# Patient Record
Sex: Male | Born: 1940 | Race: Black or African American | Hispanic: No | Marital: Married | Smoking: Never smoker
Health system: Southern US, Community
[De-identification: ages and names within clinical notes are randomized; demographics above are authoritative.]

## PROBLEM LIST (undated history)

## (undated) DIAGNOSIS — E119 Type 2 diabetes mellitus without complications: Secondary | ICD-10-CM

## (undated) HISTORY — PX: SHOULDER SURGERY: SHX246

---

## 2002-07-19 ENCOUNTER — Emergency Department (HOSPITAL_COMMUNITY): Admission: EM | Admit: 2002-07-19 | Discharge: 2002-07-19 | Payer: Self-pay | Admitting: Emergency Medicine

## 2002-07-19 ENCOUNTER — Encounter: Payer: Self-pay | Admitting: Emergency Medicine

## 2003-03-04 ENCOUNTER — Encounter: Admission: RE | Admit: 2003-03-04 | Discharge: 2003-03-04 | Payer: Self-pay | Admitting: General Practice

## 2003-04-14 ENCOUNTER — Ambulatory Visit (HOSPITAL_COMMUNITY): Admission: RE | Admit: 2003-04-14 | Discharge: 2003-04-14 | Payer: Self-pay | Admitting: General Surgery

## 2003-09-29 ENCOUNTER — Emergency Department (HOSPITAL_COMMUNITY): Admission: EM | Admit: 2003-09-29 | Discharge: 2003-09-29 | Payer: Self-pay | Admitting: Family Medicine

## 2006-08-24 ENCOUNTER — Emergency Department (HOSPITAL_COMMUNITY): Admission: EM | Admit: 2006-08-24 | Discharge: 2006-08-24 | Payer: Self-pay | Admitting: Family Medicine

## 2007-01-04 ENCOUNTER — Emergency Department (HOSPITAL_COMMUNITY): Admission: EM | Admit: 2007-01-04 | Discharge: 2007-01-04 | Payer: Self-pay | Admitting: Family Medicine

## 2007-04-01 ENCOUNTER — Encounter (HOSPITAL_COMMUNITY): Admission: RE | Admit: 2007-04-01 | Discharge: 2007-04-05 | Payer: Self-pay | Admitting: Internal Medicine

## 2007-04-12 ENCOUNTER — Emergency Department (HOSPITAL_COMMUNITY): Admission: EM | Admit: 2007-04-12 | Discharge: 2007-04-12 | Payer: Self-pay | Admitting: Emergency Medicine

## 2007-08-28 ENCOUNTER — Ambulatory Visit: Payer: Self-pay | Admitting: Cardiology

## 2007-09-17 ENCOUNTER — Ambulatory Visit: Payer: Self-pay | Admitting: Cardiology

## 2007-09-17 ENCOUNTER — Ambulatory Visit: Payer: Self-pay

## 2007-09-17 LAB — CONVERTED CEMR LAB
Albumin: 4 g/dL (ref 3.5–5.2)
Alkaline Phosphatase: 83 units/L (ref 39–117)
Cholesterol: 158 mg/dL (ref 0–200)
LDL Cholesterol: 109 mg/dL — ABNORMAL HIGH (ref 0–99)
Total CHOL/HDL Ratio: 4.9
Total Protein: 7.5 g/dL (ref 6.0–8.3)
Triglycerides: 84 mg/dL (ref 0–149)
VLDL: 17 mg/dL (ref 0–40)

## 2008-04-01 DIAGNOSIS — E0590 Thyrotoxicosis, unspecified without thyrotoxic crisis or storm: Secondary | ICD-10-CM | POA: Insufficient documentation

## 2008-04-01 DIAGNOSIS — M47812 Spondylosis without myelopathy or radiculopathy, cervical region: Secondary | ICD-10-CM | POA: Insufficient documentation

## 2008-04-01 DIAGNOSIS — A048 Other specified bacterial intestinal infections: Secondary | ICD-10-CM | POA: Insufficient documentation

## 2009-12-29 ENCOUNTER — Emergency Department (HOSPITAL_COMMUNITY)
Admission: EM | Admit: 2009-12-29 | Discharge: 2009-12-30 | Payer: Self-pay | Source: Home / Self Care | Admitting: Emergency Medicine

## 2010-01-31 ENCOUNTER — Encounter
Admission: RE | Admit: 2010-01-31 | Discharge: 2010-02-08 | Payer: Self-pay | Source: Home / Self Care | Attending: Internal Medicine | Admitting: Internal Medicine

## 2010-02-15 ENCOUNTER — Ambulatory Visit
Payer: No Typology Code available for payment source | Attending: Internal Medicine | Admitting: Rehabilitative and Restorative Service Providers"

## 2010-02-15 DIAGNOSIS — M542 Cervicalgia: Secondary | ICD-10-CM | POA: Insufficient documentation

## 2010-02-15 DIAGNOSIS — IMO0001 Reserved for inherently not codable concepts without codable children: Secondary | ICD-10-CM | POA: Insufficient documentation

## 2010-02-15 DIAGNOSIS — M25519 Pain in unspecified shoulder: Secondary | ICD-10-CM | POA: Insufficient documentation

## 2010-02-15 DIAGNOSIS — M2569 Stiffness of other specified joint, not elsewhere classified: Secondary | ICD-10-CM | POA: Insufficient documentation

## 2010-02-15 DIAGNOSIS — M545 Low back pain, unspecified: Secondary | ICD-10-CM | POA: Insufficient documentation

## 2010-02-24 ENCOUNTER — Ambulatory Visit: Payer: No Typology Code available for payment source | Admitting: Physical Therapy

## 2010-03-01 ENCOUNTER — Ambulatory Visit: Payer: No Typology Code available for payment source

## 2010-03-08 ENCOUNTER — Ambulatory Visit: Payer: No Typology Code available for payment source | Admitting: Physical Therapy

## 2010-03-15 ENCOUNTER — Ambulatory Visit: Payer: Medicare Other | Attending: Internal Medicine | Admitting: Physical Therapy

## 2010-03-15 DIAGNOSIS — M25519 Pain in unspecified shoulder: Secondary | ICD-10-CM | POA: Insufficient documentation

## 2010-03-15 DIAGNOSIS — IMO0001 Reserved for inherently not codable concepts without codable children: Secondary | ICD-10-CM | POA: Insufficient documentation

## 2010-03-15 DIAGNOSIS — M2569 Stiffness of other specified joint, not elsewhere classified: Secondary | ICD-10-CM | POA: Insufficient documentation

## 2010-03-15 DIAGNOSIS — M545 Low back pain, unspecified: Secondary | ICD-10-CM | POA: Insufficient documentation

## 2010-03-15 DIAGNOSIS — M542 Cervicalgia: Secondary | ICD-10-CM | POA: Insufficient documentation

## 2010-03-16 ENCOUNTER — Ambulatory Visit: Payer: Medicare Other | Admitting: Physical Therapy

## 2010-03-22 ENCOUNTER — Ambulatory Visit: Payer: Medicare Other | Admitting: Physical Therapy

## 2010-03-24 ENCOUNTER — Ambulatory Visit: Payer: Medicare Other | Admitting: Physical Therapy

## 2010-03-29 ENCOUNTER — Ambulatory Visit: Payer: Medicare Other | Admitting: Physical Therapy

## 2010-04-06 ENCOUNTER — Ambulatory Visit: Payer: Medicare Other | Admitting: Physical Therapy

## 2010-05-24 NOTE — Procedures (Signed)
[facility]                              EXERCISE Travis Mendoza, Travis Mendoza                          MRN:          [id number]  DATE:09/17/2007                            DOB:          09/22/1940    PRIMARY CARE PHYSICIAN:  Fleet Contras, MD at the [facility].   INDICATIONS FOR PROCEDURE:  Atypical chest pain and a 70 year old with  several risk factors.  The patient does have a normal EKG at baseline  procedure.   PROCEDURE NOTE:  The patient did exercise for 6 minutes using standard  Bruce protocol.  He reached the end of stage II.  He reached peak heart  rate of 141, which was 91% of predicted maximal heart rate.  He did  exercise 7.2 METs.  Blood pressure response was normal with a rise from  127/70 to 170/79 at peak exercise.  We did stop exercise due to fatigue.  There was no chest pain.  Review of exercise electrogram showed only  nonspecific slight upsloping ST depression in the inferior and anterior  lateral leads.  There are no significant ST changes suggestive of  ischemia.  There were no arrhythmias.   INTERPRETATION:  This is a negative treadmill stress test for ischemia.  The patient has normal exercise tolerance.   CONCLUSION:  Negative stress treadmill.     Marca Ancona, MD  Electronically Signed    DM/MedQ  DD: 09/17/2007  DT: 09/18/2007  Job #: [id number]   cc:   Fleet Contras, M.D.  Noralyn Pick. Eden Emms, MD, [facility]

## 2010-05-24 NOTE — Assessment & Plan Note (Signed)
[facility]                            CARDIOLOGY OFFICE NOTE   Travis Mendoza, Travis Mendoza                          MRN:          [id number]  DATE:08/28/2007                            DOB:          1940-02-05    PRIMARY CARE PHYSICIAN:  Fleet Contras, MD, at the [facility].   HISTORY OF PRESENT ILLNESS:  This is a 70 year old with a history of  hypothyroidism who was seen in evaluation for chest pain.  Of note, the  patient is from [place], he speaks minimal English and brings his niece  today to interpret for him.  The patient was seen in his primary care  doctor's office, where he was complaining of some chest pain.  He is  seen here today and states that he has had no further chest pain, since  he has taken medication for reflux and H. Pylori.  The patient states  that prior to coming to his primary care physician's office in July, he  was having episodes of burning in his chest when he would eat.  This  would really only occur with eating or drinking and did not occur with  exertion.  He was seen by his primary care physician.  He was started on  a Prevpac for treatment of H. pylori infection.  He has completed the  Prevpac and states that the chest burning that he was having has  completely resolved now.  He has had no episodes of chest pain or  burning in the last couple of weeks.  He does complain of pain in his  right upper back on examination at the site where he appears to have a  lipoma.  He also complains of some neck pain and he does have a history  of cervical spondylosis.  He does have reasonably good exercise  capacity.  He walks for long distances and he jogs, does not get chest  pain with this activities.  He is mildly short of breath if he jogs, and  he thinks this is probably due to getting older.  He has no orthopnea,  no PND, no episodes of syncope, or presyncope.  He also does report an  episodic headache.   EKG today; normal  sinus rhythm with nonspecific lateral T-wave  inversion.   MEDICATIONS:  1. Propranolol 10 mg b.i.d.  2. Albuterol p.r.n.  3. Aspirin 81 mg daily.   ALLERGIES:  The patient has no known drug allergies.   PAST MEDICAL HISTORY:  1. Goiter and hyperthyroidism.  2. Cervical spondylosis.  3. Past smoking, the patient did quite about 2 months ago.  4. Helicobacter pylori infection treated with a Prevpac.   SOCIAL HISTORY:  The patient is from [place].  He has been in the [place] for  about 4 years.  He speaks minimal [place].  He has an interpreter, his  niece, who has come with him today.  His immediate family is also in  [place].  He used to smoke 3 cigarettes a day, however, he quit completely  2 months ago.  He does work in  a factory.  He does not drink alcohol.  He use any illicit drugs.   FAMILY HISTORY:  The patient denies a family history of early onset of  coronary artery disease.   REVIEW OF SYSTEMS:  This is negative, except as noted in the history of  present illness.   PHYSICAL EXAMINATION:  VITALS:  Blood pressure is 131/79, heart rate is  95 and regular, and weight is 197 pounds.  GENERAL:  In no apparent distress, well-developed white male.  NEUROLOGICAL:  Alert and oriented x3.  Normal affect.  NECK:  JVP is 7-8 cm.  There is a smooth, symmetric goiter.  LUNGS:  Clear to auscultation bilaterally.  Normal respiratory  excursion.  CARDIOVASCULAR:  Regular S1 and S2.  No S3, no S4, and no murmur.  No  peripheral edema.  Posterior tibial pulses are 2+ bilaterally.  EXTREMITIES:  No clubbing or cyanosis.  HEENT:  Normal.  SKIN:  Normal.  MUSCULOSKELETAL:  There is mild tenderness to palpation over the  posterior cervical spine.  There is also a soft tissue mass in the right  upper back that is consistent with a lipoma.   ASSESSMENT AND PLAN:  This is a 70 year old who was seen in his primary  care doctor's office with chest burning, this is since appears to have  resolved  after taking a Prevpac.  1. Chest discomfort.  The patient has had no further chest discomfort      now for a couple of weeks.  He was having burning in his chest with      eating or drinking.  He did not get any symptoms with exertion.      The symptoms has completely resolved since he took a Prevpac.  He      also has pain in his right upper back, this localized to the site      of what appears to be a lipoma.  I do not think that the patient      has been having ischemic chest pain.  He does have some nonspecific      changes on his EKG.  I do, however, think it will be reasonable to      obtain an exercise treadmill test for this patient.  We will go      ahead and schedule him for an exercise treadmill test.  Also, he      should continue taking an aspirin 81 mg a day.  2. Hypothyroidism.  The patient's most recent labs showed a TSH of      less than 0.004, free T4 elevated at 2, and T3 elevated at 290.  He      does have a goiter.  The patient states that he did see an      endocrine doctor yesterday.  However, he was not started on any new      medications and he does not remember exactly what they told him.      He is supposed to follow up again in 3 months.  He does need a      close endocrine followup for his hyperthyroidism.  The patient      should continue taking his propranolol 10 mg twice a day.  3. We will check the patient's lipids.     Marca Ancona, MD  Electronically Signed    DM/MedQ  DD: 08/28/2007  DT: 08/29/2007  Job #: [id number]   cc:   Fleet Contras, M.D.  Bruce Elvera Lennox Juanda Chance,  MD, [facility]

## 2010-10-14 LAB — DIFFERENTIAL
Basophils Absolute: 0
Basophils Relative: 0
Eosinophils Absolute: 0.2
Eosinophils Relative: 2
Lymphocytes Relative: 17
Lymphs Abs: 1.6
Monocytes Absolute: 1.1 — ABNORMAL HIGH
Monocytes Relative: 12
Neutro Abs: 6.3
Neutrophils Relative %: 68

## 2010-10-14 LAB — BASIC METABOLIC PANEL
Calcium: 8.6
GFR calc Af Amer: >60
GFR calc non Af Amer: >60
Glucose, Bld: 135 — ABNORMAL HIGH
Potassium: 3.5
Sodium: 139

## 2010-10-14 LAB — CBC
HCT: 41.8
Hemoglobin: 13.8
MCHC: 32.9
MCV: 81.4
Platelets: 189
RBC: 5.14
RDW: 13.7
WBC: 9.3

## 2010-10-14 LAB — BASIC METABOLIC PANEL WITH GFR
BUN: 9
CO2: 26
Chloride: 106
Creatinine, Ser: 0.66

## 2010-10-14 LAB — RAPID STREP SCREEN (MED CTR MEBANE ONLY): Streptococcus, Group A Screen (Direct): NEGATIVE

## 2011-03-24 ENCOUNTER — Ambulatory Visit
Admission: RE | Admit: 2011-03-24 | Discharge: 2011-03-24 | Disposition: A | Discharge status: Home / Self Care | Payer: No Typology Code available for payment source | Source: Ambulatory Visit | Attending: Orthopedic Surgery | Admitting: Orthopedic Surgery

## 2011-03-24 ENCOUNTER — Ambulatory Visit
Admission: RE | Admit: 2011-03-24 | Discharge: 2011-03-24 | Disposition: A | Discharge status: Home / Self Care | Payer: PRIVATE HEALTH INSURANCE | Source: Ambulatory Visit | Attending: Orthopedic Surgery | Admitting: Orthopedic Surgery

## 2011-03-24 ENCOUNTER — Other Ambulatory Visit: Payer: Self-pay | Admitting: Orthopedic Surgery

## 2011-03-24 DIAGNOSIS — M79603 Pain in arm, unspecified: Secondary | ICD-10-CM

## 2011-10-21 ENCOUNTER — Emergency Department (HOSPITAL_COMMUNITY)
Admission: EM | Admit: 2011-10-21 | Discharge: 2011-10-21 | Disposition: A | Discharge status: Home / Self Care | Payer: Medicare Other | Attending: Emergency Medicine | Admitting: Emergency Medicine

## 2011-10-21 ENCOUNTER — Encounter (HOSPITAL_COMMUNITY): Payer: Self-pay | Admitting: *Deleted

## 2011-10-21 DIAGNOSIS — K0889 Other specified disorders of teeth and supporting structures: Secondary | ICD-10-CM

## 2011-10-21 DIAGNOSIS — K029 Dental caries, unspecified: Secondary | ICD-10-CM | POA: Insufficient documentation

## 2011-10-21 MED ORDER — PENICILLIN V POTASSIUM 500 MG PO TABS: 500.0000 mg | ORAL_TABLET | Freq: Three times a day (TID) | ORAL | Status: DC

## 2011-10-21 MED ORDER — HYDROCODONE-ACETAMINOPHEN 10-325 MG PO TABS: 1.0000 | ORAL_TABLET | Freq: Four times a day (QID) | ORAL | Status: DC | PRN

## 2011-10-21 NOTE — ED Provider Notes (Signed)
History     CSN: [id number]  Arrival date & time 10/21/11  1711   First MD Initiated Contact with Patient 10/21/11 2017      Chief Complaint  Patient presents with  . Dental Pain   HPI  History provided by the patient and son. Patient is a 71 year old male with no significant PMH who presents with complaints of left upper molar pain. Symptoms began yesterday and have worsened significantly. Patient denies any associated swelling of the gums or bleeding. Denies any fever, chills or sweats. Denies any nausea vomiting. He denies any dental injury. Patient denies any other aggravating or alleviating factors. He has not used anything else for his symptoms.    History reviewed. No pertinent past medical history.  History reviewed. No pertinent past surgical history.  No family history on file.  History  Substance Use Topics  . Smoking status: Never Smoker   . Smokeless tobacco: Not on file  . Alcohol Use: No      Review of Systems  Constitutional: Negative for fever and chills.  HENT: Positive for dental problem. Negative for sore throat and trouble swallowing.   Gastrointestinal: Negative for nausea and vomiting.  Skin: Negative for rash.    Allergies  Review of patient's allergies indicates no known allergies.  Home Medications  No current outpatient prescriptions on file.  BP 125/73  Pulse 100  Temp 98.2 F (36.8 C) (Oral)  Resp 20  SpO2 97%  Physical Exam  Nursing note and vitals reviewed. Constitutional: He is oriented to person, place, and time. He appears well-developed and well-nourished. No distress.  HENT:  Head: Normocephalic.  Mouth/Throat:         Pain with percussion over left upper first molar.  Dental caries present.  No swelling of adjacent gums.  Neck: Normal range of motion. Neck supple.  Cardiovascular: Normal rate and regular rhythm.   Pulmonary/Chest: Effort normal and breath sounds normal.  Lymphadenopathy:    He has no cervical  adenopathy.  Neurological: He is alert and oriented to person, place, and time.  Skin: Skin is warm.  Psychiatric: He has a normal mood and affect. His behavior is normal.    ED Course  Procedures   Dental Block Performed by: Angus Seller Authorized by: Angus Seller Consent: Verbal consent obtained. Risks and benefits: risks, benefits and alternatives were discussed Consent given by: patient Patient identity confirmed: provided demographic data  Location: Left upper first molar  Local anesthetic: Bupivacaine 0.5% with epinephrine  Anesthetic total: 1.8 ml  Irrigation method: syringe  Patient tolerance: Patient tolerated the procedure well with no immediate complications. Pain improved.       1. Pain, dental   2. Dental caries       MDM  8:35PM Pt seen and evaluated.  Pt in no acute distress.  Pt does not appear toxic.  Pain improved after dental block.  Pt given dental referral.        Angus Seller, PA 10/22/11 2122

## 2011-10-21 NOTE — ED Notes (Signed)
Pt updated on wait time.  

## 2011-10-21 NOTE — ED Notes (Signed)
Patient has pain in the left upper 4 and 5th tooth.  There is noted filling and dark area to the teeth.  Onset of pain last night.  Patient friend is here to interpret

## 2011-10-23 NOTE — ED Provider Notes (Signed)
Medical screening examination/treatment/procedure(s) were performed by non-physician practitioner and as supervising physician I was immediately available for consultation/collaboration.  Doug Sou, MD 10/23/11 [phone]

## 2011-12-18 ENCOUNTER — Other Ambulatory Visit: Payer: Self-pay | Admitting: Internal Medicine

## 2011-12-18 DIAGNOSIS — E042 Nontoxic multinodular goiter: Secondary | ICD-10-CM

## 2011-12-27 ENCOUNTER — Ambulatory Visit
Admission: RE | Admit: 2011-12-27 | Discharge: 2011-12-27 | Disposition: A | Discharge status: Home / Self Care | Payer: Medicare Other | Source: Ambulatory Visit | Attending: Internal Medicine | Admitting: Internal Medicine

## 2011-12-27 ENCOUNTER — Ambulatory Visit
Admission: RE | Admit: 2011-12-27 | Discharge: 2011-12-27 | Disposition: A | Discharge status: Home / Self Care | Payer: PRIVATE HEALTH INSURANCE | Source: Ambulatory Visit | Attending: Internal Medicine | Admitting: Internal Medicine

## 2011-12-27 DIAGNOSIS — E042 Nontoxic multinodular goiter: Secondary | ICD-10-CM

## 2012-02-08 ENCOUNTER — Other Ambulatory Visit (HOSPITAL_COMMUNITY): Payer: Self-pay | Admitting: Internal Medicine

## 2012-02-08 ENCOUNTER — Ambulatory Visit (HOSPITAL_COMMUNITY)
Admission: RE | Admit: 2012-02-08 | Discharge: 2012-02-08 | Disposition: A | Discharge status: Home / Self Care | Payer: Medicare Other | Source: Ambulatory Visit | Attending: Internal Medicine | Admitting: Internal Medicine

## 2012-02-08 DIAGNOSIS — R071 Chest pain on breathing: Secondary | ICD-10-CM

## 2012-02-08 DIAGNOSIS — E049 Nontoxic goiter, unspecified: Secondary | ICD-10-CM | POA: Insufficient documentation

## 2012-05-16 ENCOUNTER — Other Ambulatory Visit: Payer: Self-pay | Admitting: Internal Medicine

## 2012-05-16 DIAGNOSIS — E0590 Thyrotoxicosis, unspecified without thyrotoxic crisis or storm: Secondary | ICD-10-CM

## 2012-05-17 ENCOUNTER — Other Ambulatory Visit: Payer: Self-pay | Admitting: Gastroenterology

## 2012-05-27 ENCOUNTER — Encounter (HOSPITAL_COMMUNITY)
Admission: RE | Admit: 2012-05-27 | Discharge: 2012-05-27 | Disposition: A | Discharge status: Home / Self Care | Payer: Medicare Other | Source: Ambulatory Visit | Attending: Internal Medicine | Admitting: Internal Medicine

## 2012-05-27 DIAGNOSIS — R221 Localized swelling, mass and lump, neck: Secondary | ICD-10-CM | POA: Insufficient documentation

## 2012-05-27 DIAGNOSIS — E0520 Thyrotoxicosis with toxic multinodular goiter without thyrotoxic crisis or storm: Secondary | ICD-10-CM | POA: Insufficient documentation

## 2012-05-27 DIAGNOSIS — R220 Localized swelling, mass and lump, head: Secondary | ICD-10-CM | POA: Insufficient documentation

## 2012-05-27 DIAGNOSIS — E0590 Thyrotoxicosis, unspecified without thyrotoxic crisis or storm: Secondary | ICD-10-CM

## 2012-05-27 MED ORDER — SODIUM IODIDE I 131 CAPSULE
7.8000 | Freq: Once | INTRAVENOUS | Status: AC | PRN
  Administered 2012-05-27: 7.8 via ORAL

## 2012-05-28 ENCOUNTER — Encounter (HOSPITAL_COMMUNITY)
Admission: RE | Admit: 2012-05-28 | Discharge: 2012-05-28 | Disposition: A | Discharge status: Home / Self Care | Payer: Medicare Other | Source: Ambulatory Visit | Attending: Internal Medicine | Admitting: Internal Medicine

## 2012-05-30 ENCOUNTER — Encounter (HOSPITAL_COMMUNITY): Payer: Medicare Other

## 2012-06-07 ENCOUNTER — Encounter (HOSPITAL_COMMUNITY)
Admission: RE | Admit: 2012-06-07 | Discharge: 2012-06-07 | Disposition: A | Discharge status: Home / Self Care | Payer: Medicare Other | Source: Ambulatory Visit | Attending: Internal Medicine | Admitting: Internal Medicine

## 2012-06-07 DIAGNOSIS — E0590 Thyrotoxicosis, unspecified without thyrotoxic crisis or storm: Secondary | ICD-10-CM

## 2012-06-07 DIAGNOSIS — E0520 Thyrotoxicosis with toxic multinodular goiter without thyrotoxic crisis or storm: Secondary | ICD-10-CM | POA: Insufficient documentation

## 2012-06-07 MED ORDER — SODIUM IODIDE I 131 CAPSULE
44.0000 | Freq: Once | INTRAVENOUS | Status: AC | PRN
  Administered 2012-06-07: 44 via ORAL

## 2014-09-03 ENCOUNTER — Encounter (HOSPITAL_COMMUNITY): Payer: Self-pay | Admitting: Family Medicine

## 2014-09-03 ENCOUNTER — Emergency Department (HOSPITAL_COMMUNITY): Payer: Medicare Other

## 2014-09-03 ENCOUNTER — Emergency Department (HOSPITAL_COMMUNITY)
Admission: EM | Admit: 2014-09-03 | Discharge: 2014-09-03 | Disposition: A | Discharge status: Home / Self Care | Payer: Medicare Other | Attending: Emergency Medicine | Admitting: Emergency Medicine

## 2014-09-03 DIAGNOSIS — R1031 Right lower quadrant pain: Secondary | ICD-10-CM | POA: Insufficient documentation

## 2014-09-03 DIAGNOSIS — Z792 Long term (current) use of antibiotics: Secondary | ICD-10-CM | POA: Insufficient documentation

## 2014-09-03 DIAGNOSIS — R109 Unspecified abdominal pain: Secondary | ICD-10-CM

## 2014-09-03 LAB — URINALYSIS, ROUTINE W REFLEX MICROSCOPIC
BILIRUBIN URINE: NEGATIVE
Glucose, UA: NEGATIVE mg/dL
Ketones, ur: NEGATIVE mg/dL
Leukocytes, UA: NEGATIVE
NITRITE: NEGATIVE
PROTEIN: NEGATIVE mg/dL
SPECIFIC GRAVITY, URINE: 1.022 (ref 1.005–1.030)
UROBILINOGEN UA: 1 mg/dL (ref 0.0–1.0)
pH: 6 (ref 5.0–8.0)

## 2014-09-03 LAB — COMPREHENSIVE METABOLIC PANEL
ALK PHOS: 55 U/L (ref 38–126)
ALT: 21 U/L (ref 17–63)
ANION GAP: 7 (ref 5–15)
AST: 20 U/L (ref 15–41)
Albumin: 3.9 g/dL (ref 3.5–5.0)
BILIRUBIN TOTAL: 0.9 mg/dL (ref 0.3–1.2)
BUN: 14 mg/dL (ref 6–20)
CALCIUM: 9.3 mg/dL (ref 8.9–10.3)
CO2: 26 mmol/L (ref 22–32)
Chloride: 105 mmol/L (ref 101–111)
Creatinine, Ser: 0.98 mg/dL (ref 0.61–1.24)
GFR calc Af Amer: >60 mL/min (ref >60)
Glucose, Bld: 106 mg/dL — ABNORMAL HIGH (ref 65–99)
POTASSIUM: 3.9 mmol/L (ref 3.5–5.1)
Sodium: 138 mmol/L (ref 135–145)
TOTAL PROTEIN: 8 g/dL (ref 6.5–8.1)

## 2014-09-03 LAB — CBC
HEMATOCRIT: 45.7 % (ref 39.0–52.0)
HEMOGLOBIN: 15.3 g/dL (ref 13.0–17.0)
MCH: 29.1 pg (ref 26.0–34.0)
MCHC: 33.5 g/dL (ref 30.0–36.0)
MCV: 87 fL (ref 78.0–100.0)
Platelets: 202 10*3/uL (ref 150–400)
RBC: 5.25 MIL/uL (ref 4.22–5.81)
RDW: 12.6 % (ref 11.5–15.5)
WBC: 11.2 10*3/uL — AB (ref 4.0–10.5)

## 2014-09-03 LAB — URINE MICROSCOPIC-ADD ON

## 2014-09-03 LAB — LIPASE, BLOOD: Lipase: 26 U/L (ref 22–51)

## 2014-09-03 MED ORDER — ONDANSETRON HCL 4 MG/2ML IJ SOLN
4.0000 mg | Freq: Once | INTRAMUSCULAR | Status: AC
  Administered 2014-09-03: 4 mg via INTRAVENOUS
  Filled 2014-09-03: qty 2

## 2014-09-03 MED ORDER — FAMOTIDINE 20 MG PO TABS
20.0000 mg | ORAL_TABLET | Freq: Once | ORAL | Status: AC
  Administered 2014-09-03: 20 mg via ORAL
  Filled 2014-09-03: qty 1

## 2014-09-03 MED ORDER — IOHEXOL 300 MG/ML  SOLN
100.0000 mL | Freq: Once | INTRAMUSCULAR | Status: AC | PRN
  Administered 2014-09-03: 100 mL via INTRAVENOUS

## 2014-09-03 MED ORDER — IOHEXOL 300 MG/ML  SOLN
25.0000 mL | Freq: Once | INTRAMUSCULAR | Status: AC | PRN
  Administered 2014-09-03: 25 mL via ORAL

## 2014-09-03 MED ORDER — SODIUM CHLORIDE 0.9 % IV BOLUS (SEPSIS)
500.0000 mL | Freq: Once | INTRAVENOUS | Status: AC
  Administered 2014-09-03: 500 mL via INTRAVENOUS

## 2014-09-03 MED ORDER — HYDROMORPHONE HCL 1 MG/ML IJ SOLN
1.0000 mg | Freq: Once | INTRAMUSCULAR | Status: AC
  Administered 2014-09-03: 1 mg via INTRAVENOUS
  Filled 2014-09-03: qty 1

## 2014-09-03 MED ORDER — GI COCKTAIL ~~LOC~~
30.0000 mL | Freq: Once | ORAL | Status: AC
  Administered 2014-09-03: 30 mL via ORAL
  Filled 2014-09-03: qty 30

## 2014-09-03 NOTE — Discharge Instructions (Signed)
It was our pleasure to provide your ER care today - we hope that you feel better.  You may try pepcid or zantac as need for symptom relief.  Take tylenol as need.   Follow up with primary care doctor in the next 1-2 days for recheck.  Return to ER right away if worse, new symptoms, fevers, worsening or severe pain, persistent vomiting, other concern.  You were given pain medication in the ER - no driving for the next 4 hours.    Abdominal Pain Many things can cause abdominal pain. Usually, abdominal pain is not caused by a disease and will improve without treatment. It can often be observed and treated at home. Your health care provider will do a physical exam and possibly order blood tests and X-rays to help determine the seriousness of your pain. However, in many cases, more time must pass before a clear cause of the pain can be found. Before that point, your health care provider may not know if you need more testing or further treatment. HOME CARE INSTRUCTIONS  Monitor your abdominal pain for any changes. The following actions may help to alleviate any discomfort you are experiencing:  Only take over-the-counter or prescription medicines as directed by your health care provider.  Do not take laxatives unless directed to do so by your health care provider.  Try a clear liquid diet (broth, tea, or water) as directed by your health care provider. Slowly move to a bland diet as tolerated. SEEK MEDICAL CARE IF:  You have unexplained abdominal pain.  You have abdominal pain associated with nausea or diarrhea.  You have pain when you urinate or have a bowel movement.  You experience abdominal pain that wakes you in the night.  You have abdominal pain that is worsened or improved by eating food.  You have abdominal pain that is worsened with eating fatty foods.  You have a fever. SEEK IMMEDIATE MEDICAL CARE IF:   Your pain does not go away within 2 hours.  You keep throwing up  (vomiting).  Your pain is felt only in portions of the abdomen, such as the right side or the left lower portion of the abdomen.  You pass bloody or black tarry stools. MAKE SURE YOU:  Understand these instructions.   Will watch your condition.   Will get help right away if you are not doing well or get worse.  Document Released: 10/05/2004 Document Revised: 12/31/2012 Document Reviewed: 09/04/2012 [facility] Patient Information 2015 [place], [place]. This information is not intended to replace advice given to you by your health care provider. Make sure you discuss any questions you have with your health care provider.

## 2014-09-03 NOTE — ED Provider Notes (Signed)
CSN: [id number]     Arrival date & time 09/03/14  1631 History   First MD Initiated Contact with Patient 09/03/14 1826     Chief Complaint  Patient presents with  . Abdominal Pain     (Consider location/radiation/quality/duration/timing/severity/associated sxs/prior Treatment) Patient is a 74 y.o. male presenting with abdominal pain. The history is provided by the patient and a relative. A language interpreter was used.  Abdominal Pain Associated symptoms: no chest pain, no diarrhea, no fever, no hematuria, no shortness of breath, no sore throat and no vomiting   Patient c/o right lower abdominal pain onset yesterday. Pain dull, constant, moderate-severe, getting worse. Worse w palpation. Decreased appetite. Nausea. No vomiting or diarrhea. No hx same pain previously. No hx kidney stones or gallstones. No prior abd surgery. No fevers. No back or flank pain. No dysuria or hematuria.   History reviewed. No pertinent past medical history. History reviewed. No pertinent past surgical history. History reviewed. No pertinent family history. Social History  Substance Use Topics  . Smoking status: Never Smoker   . Smokeless tobacco: None  . Alcohol Use: No    Review of Systems  Constitutional: Negative for fever.  HENT: Negative for sore throat.   Eyes: Negative for redness.  Respiratory: Negative for shortness of breath.   Cardiovascular: Negative for chest pain.  Gastrointestinal: Positive for abdominal pain. Negative for vomiting and diarrhea.  Genitourinary: Negative for hematuria and flank pain.  Musculoskeletal: Negative for back pain and neck pain.  Skin: Negative for rash.  Neurological: Negative for headaches.  Hematological: Does not bruise/bleed easily.  Psychiatric/Behavioral: Negative for confusion.      Allergies  Review of patient's allergies indicates no known allergies.  Home Medications   Prior to Admission medications   Medication Sig Start Date End Date  Taking? Authorizing Provider  HYDROcodone-acetaminophen (NORCO) 10-325 MG per tablet Take 1 tablet by mouth every 6 (six) hours as needed for pain. 10/21/11   Ivonne Andrew, PA-C  penicillin v potassium (VEETID) 500 MG tablet Take 1 tablet (500 mg total) by mouth 3 (three) times daily. 10/21/11   Peter Dammen, PA-C   BP 118/60 mmHg  Pulse 81  Temp(Src) 98.1 F (36.7 C)  Wt 206 lb 1 oz (93.469 kg)  SpO2 98% Physical Exam  Constitutional: He is oriented to person, place, and time. He appears well-developed and well-nourished.  Uncomfortable appearing.   HENT:  Nose: Nose normal.  Mouth/Throat: Oropharynx is clear and moist.  Eyes: Conjunctivae are normal. No scleral icterus.  Neck: Neck supple. No tracheal deviation present.  Cardiovascular: Normal rate, regular rhythm, normal heart sounds and intact distal pulses.   Pulmonary/Chest: Effort normal and breath sounds normal. No accessory muscle usage. No respiratory distress.  Abdominal: Soft. Bowel sounds are normal. He exhibits no distension and no mass. There is tenderness. There is no rebound and no guarding.  Moderate rlq tenderness. No puls mass.   Genitourinary:  No cva tenderness  Musculoskeletal: Normal range of motion.  Neurological: He is alert and oriented to person, place, and time.  Skin: Skin is warm and dry. No rash noted. He is not diaphoretic.  No shingles/rash in area of pain  Psychiatric: He has a normal mood and affect.  Nursing note and vitals reviewed.   ED Course  Procedures (including critical care time) Labs Review  Results for orders placed or performed during the hospital encounter of 09/03/14  Lipase, blood  Result Value Ref Range   Lipase 26 22 -  51 U/L  Comprehensive metabolic panel  Result Value Ref Range   Sodium 138 135 - 145 mmol/L   Potassium 3.9 3.5 - 5.1 mmol/L   Chloride 105 101 - 111 mmol/L   CO2 26 22 - 32 mmol/L   Glucose, Bld 106 (H) 65 - 99 mg/dL   BUN 14 6 - 20 mg/dL    Creatinine, Ser 1.61 0.61 - 1.24 mg/dL   Calcium 9.3 8.9 - 09.6 mg/dL   Total Protein 8.0 6.5 - 8.1 g/dL   Albumin 3.9 3.5 - 5.0 g/dL   AST 20 15 - 41 U/L   ALT 21 17 - 63 U/L   Alkaline Phosphatase 55 38 - 126 U/L   Total Bilirubin 0.9 0.3 - 1.2 mg/dL   GFR calc non Af Amer >60 >60 mL/min   GFR calc Af Amer >60 >60 mL/min   Anion gap 7 5 - 15  CBC  Result Value Ref Range   WBC 11.2 (H) 4.0 - 10.5 K/uL   RBC 5.25 4.22 - 5.81 MIL/uL   Hemoglobin 15.3 13.0 - 17.0 g/dL   HCT 04.5 40.9 - 81.1 %   MCV 87.0 78.0 - 100.0 fL   MCH 29.1 26.0 - 34.0 pg   MCHC 33.5 30.0 - 36.0 g/dL   RDW 91.4 78.2 - 95.6 %   Platelets 202 150 - 400 K/uL  Urinalysis, Routine w reflex microscopic (not at [facility])  Result Value Ref Range   Color, Urine YELLOW YELLOW   APPearance CLEAR CLEAR   Specific Gravity, Urine 1.022 1.005 - 1.030   pH 6.0 5.0 - 8.0   Glucose, UA NEGATIVE NEGATIVE mg/dL   Hgb urine dipstick TRACE (A) NEGATIVE   Bilirubin Urine NEGATIVE NEGATIVE   Ketones, ur NEGATIVE NEGATIVE mg/dL   Protein, ur NEGATIVE NEGATIVE mg/dL   Urobilinogen, UA 1.0 0.0 - 1.0 mg/dL   Nitrite NEGATIVE NEGATIVE   Leukocytes, UA NEGATIVE NEGATIVE  Urine microscopic-add on  Result Value Ref Range   WBC, UA 0-2 <3 WBC/hpf   Bacteria, UA RARE RARE   Ct Abdomen Pelvis W Contrast  09/03/2014   CLINICAL DATA:  Right lower quadrant abdomen pain.  EXAM: CT ABDOMEN AND PELVIS WITH CONTRAST  TECHNIQUE: Multidetector CT imaging of the abdomen and pelvis was performed using the standard protocol following bolus administration of intravenous contrast.  CONTRAST:  OMNIPAQUE IOHEXOL 300 MG/ML  SOLN  COMPARISON:  None.  FINDINGS: There is diffuse low density of liver without vessel displacement. The spleen, pancreas, gallbladder, adrenal glands are normal. There are bilateral kidney cysts, largest measures 1.3 cm and lower pole right kidney. There is no hydronephrosis bilaterally. The aorta is normal. There is no abdominal  lymphadenopathy.  There is no small bowel obstruction or diverticulitis. The appendix is normal.  Fluid-filled bladder is normal. There is no pelvic lymphadenopathy. There is mild dependent atelectasis of the posterior lung bases. Degenerative joint changes of the spine are noted.  IMPRESSION: No small bowel obstruction or diverticulitis.  The appendix normal.  Fatty infiltration of liver.   Electronically Signed   By: Sherian Rein M.D.   On: 09/03/2014 20:54     I have personally reviewed and evaluated these images and lab results as part of my medical decision-making.    MDM   Iv ns bolus. Labs. Ua.  Dilaudid 1 mg iv. zofran iv.  Reviewed nursing notes and prior charts for additional history.   Recheck, pt also points to epigastric area as  mild area pain. Gi cocktail and pepcid po.  Ct neg acute.  Recheck pt much more comfortable, no nv. Afeb.  Pt currently appears stable for d/c.  Return precautions provided.       Cathren Laine, MD 09/03/14 2115

## 2014-09-03 NOTE — ED Notes (Signed)
MD at bedside. 

## 2014-09-03 NOTE — ED Notes (Signed)
Pt here for right lower abd pain/flank pain since yesterday. Denies N,V,D. sts LBM 10 pm yesterday and normal. Denies bleeding.

## 2015-05-31 ENCOUNTER — Encounter (HOSPITAL_COMMUNITY): Payer: Self-pay | Admitting: Emergency Medicine

## 2015-05-31 ENCOUNTER — Ambulatory Visit (HOSPITAL_COMMUNITY)
Admission: EM | Admit: 2015-05-31 | Discharge: 2015-05-31 | Disposition: A | Discharge status: Nursing Home (Includes ICF) | Payer: Medicare Other | Attending: Family Medicine | Admitting: Family Medicine

## 2015-05-31 DIAGNOSIS — R51 Headache: Secondary | ICD-10-CM | POA: Insufficient documentation

## 2015-05-31 DIAGNOSIS — E119 Type 2 diabetes mellitus without complications: Secondary | ICD-10-CM | POA: Insufficient documentation

## 2015-05-31 DIAGNOSIS — R519 Headache, unspecified: Secondary | ICD-10-CM

## 2015-05-31 DIAGNOSIS — Z79899 Other long term (current) drug therapy: Secondary | ICD-10-CM | POA: Insufficient documentation

## 2015-05-31 DIAGNOSIS — Z7984 Long term (current) use of oral hypoglycemic drugs: Secondary | ICD-10-CM | POA: Insufficient documentation

## 2015-05-31 DIAGNOSIS — R509 Fever, unspecified: Secondary | ICD-10-CM | POA: Insufficient documentation

## 2015-05-31 DIAGNOSIS — M542 Cervicalgia: Secondary | ICD-10-CM | POA: Insufficient documentation

## 2015-05-31 HISTORY — DX: Type 2 diabetes mellitus without complications: E11.9

## 2015-05-31 NOTE — ED Provider Notes (Signed)
CSN: [id number]     Arrival date & time 05/31/15  1750 History   First MD Initiated Contact with Patient 05/31/15 1816     Chief Complaint  Patient presents with  . Headache   (Consider location/radiation/quality/duration/timing/severity/associated sxs/prior Treatment) Patient is a 75 y.o. male presenting with headaches. The history is provided by the patient. The history is limited by a language barrier. A language interpreter was used (friend interp.).  Headache Pain location:  Generalized Quality:  Dull Radiates to:  Does not radiate Onset quality:  Gradual Duration:  1 week Progression:  Unchanged Chronicity:  New Similar to prior headaches: no   Relieved by:  None tried Ineffective treatments:  None tried Associated symptoms: fever and nausea   Associated symptoms: no abdominal pain, no back pain, no diarrhea, no drainage, no focal weakness, no neck stiffness, no photophobia, no sinus pressure, no sore throat and no vomiting     Past Medical History  Diagnosis Date  . Diabetes mellitus without complication [facility](HCC)    Past Surgical History  Procedure Laterality Date  . Shoulder surgery     History reviewed. No pertinent family history. Social History  Substance Use Topics  . Smoking status: Never Smoker   . Smokeless tobacco: None  . Alcohol Use: No    Review of Systems  Constitutional: Positive for fever.  HENT: Negative for postnasal drip, rhinorrhea, sinus pressure and sore throat.   Eyes: Negative.  Negative for photophobia.  Gastrointestinal: Positive for nausea. Negative for vomiting, abdominal pain and diarrhea.  Genitourinary: Negative.   Musculoskeletal: Negative for back pain and neck stiffness.  Neurological: Positive for headaches. Negative for focal weakness.  All other systems reviewed and are negative.   Allergies  Review of patient's allergies indicates no known allergies.  Home Medications   Prior to Admission medications   Medication Sig  Start Date End Date Taking? Authorizing Provider  tiZANidine (ZANAFLEX) 4 MG tablet Take 4 mg by mouth 2 (two) times daily as needed for muscle spasms.    Historical Provider, MD   Meds Ordered and Administered this Visit  Medications - No data to display  BP 130/79 mmHg  Pulse 89  Temp(Src) 99.4 F (37.4 C) (Oral)  Resp 18  SpO2 95% No data found.   Physical Exam  Constitutional: He is oriented to person, place, and time. He appears well-developed and well-nourished. No distress.  HENT:  Right Ear: External ear normal.  Left Ear: External ear normal.  Mouth/Throat: Oropharynx is clear and moist.  Eyes: Pupils are equal, round, and reactive to light.  Neck: Normal range of motion. Neck supple.  Cardiovascular: Regular rhythm and normal heart sounds.   Pulmonary/Chest: Effort normal and breath sounds normal.  Musculoskeletal: Normal range of motion.  Lymphadenopathy:    He has no cervical adenopathy.  Neurological: He is alert and oriented to person, place, and time.  Skin: Skin is warm and dry.  Nursing note and vitals reviewed.   ED Course  Procedures (including critical care time)  Labs Review Labs Reviewed - No data to display  Imaging Review No results found.   Visual Acuity Review  Right Eye Distance:   Left Eye Distance:   Bilateral Distance:    Right Eye Near:   Left Eye Near:    Bilateral Near:         MDM   1. Acute intractable headache, unspecified headache type    sennt for HA eval for 1 week assoc with fever and  nausea.    Linna Hoff, MD 05/31/15 [phone]

## 2015-05-31 NOTE — ED Notes (Signed)
Pt reports headache and fever x 1 week. Pt alert x4. NAD at this time.

## 2015-05-31 NOTE — ED Notes (Signed)
[facility] with a complaint of a headache since yesterday.

## 2015-06-01 ENCOUNTER — Emergency Department (HOSPITAL_COMMUNITY): Payer: Self-pay

## 2015-06-01 ENCOUNTER — Emergency Department (HOSPITAL_COMMUNITY)
Admission: EM | Admit: 2015-06-01 | Discharge: 2015-06-01 | Disposition: A | Discharge status: Home / Self Care | Payer: Self-pay | Attending: Emergency Medicine | Admitting: Emergency Medicine

## 2015-06-01 DIAGNOSIS — R519 Headache, unspecified: Secondary | ICD-10-CM

## 2015-06-01 DIAGNOSIS — R51 Headache: Secondary | ICD-10-CM

## 2015-06-01 LAB — BASIC METABOLIC PANEL
Anion gap: 9 (ref 5–15)
BUN: 13 mg/dL (ref 6–20)
CO2: 24 mmol/L (ref 22–32)
Calcium: 9.4 mg/dL (ref 8.9–10.3)
Chloride: 101 mmol/L (ref 101–111)
Creatinine, Ser: 0.85 mg/dL (ref 0.61–1.24)
GFR calc Af Amer: >60 mL/min (ref >60)
GFR calc non Af Amer: >60 mL/min (ref >60)
Glucose, Bld: 128 mg/dL — ABNORMAL HIGH (ref 65–99)
Potassium: 4.9 mmol/L (ref 3.5–5.1)
Sodium: 134 mmol/L — ABNORMAL LOW (ref 135–145)

## 2015-06-01 LAB — CBC WITH DIFFERENTIAL/PLATELET
Basophils Absolute: 0 10*3/uL (ref 0.0–0.1)
Basophils Relative: 0 %
EOS PCT: 1 %
Eosinophils Absolute: 0.1 10*3/uL (ref 0.0–0.7)
HCT: 43.7 % (ref 39.0–52.0)
Hemoglobin: 14.8 g/dL (ref 13.0–17.0)
LYMPHS ABS: 2.6 10*3/uL (ref 0.7–4.0)
Lymphocytes Relative: 27 %
MCH: 27.7 pg (ref 26.0–34.0)
MCHC: 33.9 g/dL (ref 30.0–36.0)
MCV: 81.8 fL (ref 78.0–100.0)
MONO ABS: 1.1 10*3/uL — AB (ref 0.1–1.0)
MONOS PCT: 11 %
NEUTROS PCT: 61 %
Neutro Abs: 5.7 10*3/uL (ref 1.7–7.7)
PLATELETS: 199 10*3/uL (ref 150–400)
RBC: 5.34 MIL/uL (ref 4.22–5.81)
RDW: 13 % (ref 11.5–15.5)
WBC: 9.5 10*3/uL (ref 4.0–10.5)

## 2015-06-01 MED ORDER — DIPHENHYDRAMINE HCL 50 MG/ML IJ SOLN
25.0000 mg | Freq: Once | INTRAMUSCULAR | Status: AC
  Administered 2015-06-01: 25 mg via INTRAVENOUS
  Filled 2015-06-01: qty 1

## 2015-06-01 MED ORDER — SODIUM CHLORIDE 0.9 % IV BOLUS (SEPSIS)
1000.0000 mL | Freq: Once | INTRAVENOUS | Status: AC
  Administered 2015-06-01: 1000 mL via INTRAVENOUS

## 2015-06-01 MED ORDER — PROCHLORPERAZINE EDISYLATE 5 MG/ML IJ SOLN
10.0000 mg | Freq: Once | INTRAMUSCULAR | Status: AC
  Administered 2015-06-01: 10 mg via INTRAVENOUS
  Filled 2015-06-01: qty 2

## 2015-06-01 NOTE — Discharge Instructions (Signed)

## 2015-06-01 NOTE — ED Notes (Signed)
Pt taken to CT.

## 2015-06-01 NOTE — ED Provider Notes (Signed)
CSN: [id number]     Arrival date & time 05/31/15  1836 History  By signing my name below, I, Travis Mendoza, attest that this documentation has been prepared under the direction and in the presence of Shon Baton, MD. Electronically Signed: [name], ED Scribe. 06/01/2015. 3:40 AM     Chief Complaint  Patient presents with  . Headache  . Fever   The history is provided by the patient and a relative. A language interpreter was used.   Travis Mendoza is a 75 y.o. male with PMHx of DM who presents to the Emergency Department complaining of a daily generalized headache with onset 1 week ago. The pain worsened over the last 2 days and is currently rated 9/10 in severity. Over the last six months he has had frequent headaches that are worse in the morning and relieved by Advil.  Headache is frontal in route surround. Throbbing. Denies worst headache of his life.  Pt denies measured fever, sore throat, neck stiffness, cough, SOB, nausea, vomiting, weakness, numbness, and tingling.   Patient was seen at urgent care. Referred here for fever and headache. Temperature there 99.4. Patient denies any fevers at home. Denies any infectious symptoms or neck stiffness.  The pt is primarily Arabic-speaking. His son is at bedside translating.   Past Medical History  Diagnosis Date  . Diabetes mellitus without complication [facility])    Past Surgical History  Procedure Laterality Date  . Shoulder surgery     No family history on file. Social History  Substance Use Topics  . Smoking status: Never Smoker   . Smokeless tobacco: None  . Alcohol Use: No    Review of Systems  Constitutional: Negative for fever.  HENT: Negative for sore throat.   Eyes: Negative for visual disturbance.  Respiratory: Negative for cough and shortness of breath.   Gastrointestinal: Negative for nausea and vomiting.  Musculoskeletal: Positive for neck pain. Negative for neck stiffness.  Neurological: Positive for  headaches. Negative for weakness and numbness.  All other systems reviewed and are negative.  Allergies  Review of patient's allergies indicates no known allergies.  Home Medications   Prior to Admission medications   Medication Sig Start Date End Date Taking? Authorizing Provider  metFORMIN (GLUCOPHAGE) 500 MG tablet Take 500 mg by mouth 2 (two) times daily. 05/17/15  Yes Historical Provider, MD  omeprazole (PRILOSEC) 20 MG capsule Take 20 mg by mouth 2 (two) times daily. 04/22/15  Yes Historical Provider, MD  tiZANidine (ZANAFLEX) 4 MG tablet Take 4 mg by mouth 2 (two) times daily as needed for muscle spasms.  04/15/15  Yes Historical Provider, MD  traMADol (ULTRAM) 50 MG tablet Take 50 mg by mouth 2 (two) times daily as needed. 04/15/15  Yes Historical Provider, MD   BP 99/80 mmHg  Pulse 90  Temp(Src) 99 F (37.2 C) (Oral)  Resp 18  SpO2 100% Physical Exam  Constitutional: He is oriented to person, place, and time. He appears well-developed and well-nourished. No distress.  HENT:  Head: Normocephalic and atraumatic.  Mouth/Throat: Oropharynx is clear and moist.  Eyes: Pupils are equal, round, and reactive to light.  Neck: Normal range of motion. Neck supple.  No meningismus  Cardiovascular: Normal rate, regular rhythm and normal heart sounds.   No murmur heard. Pulmonary/Chest: Effort normal and breath sounds normal. No respiratory distress. He has no wheezes.  Abdominal: Soft. Bowel sounds are normal. There is no tenderness. There is no rebound.  Musculoskeletal: He exhibits no  edema.  Neurological: He is alert and oriented to person, place, and time.  Cranial nerves II through XII intact, 5 out of 5 strength in all 4 extremities, normal gait  Skin: Skin is warm and dry.  Psychiatric: He has a normal mood and affect.  Nursing note and vitals reviewed.   ED Course  Procedures (including critical care time) DIAGNOSTIC STUDIES: Oxygen Saturation is 100% on RA,  normal by my  interpretation.    COORDINATION OF CARE: 3:37 AM Discussed treatment plan which includes lab work, CT head without contrast, Compazine, Benadryl, and IVF with pt at bedside and pt agreed to plan.  Labs Review Labs Reviewed  CBC WITH DIFFERENTIAL/PLATELET - Abnormal; Notable for the following:    Monocytes Absolute 1.1 (*)    All other components within normal limits  BASIC METABOLIC PANEL - Abnormal; Notable for the following:    Sodium 134 (*)    Glucose, Bld 128 (*)    All other components within normal limits    Imaging Review Ct Head Wo Contrast  06/01/2015  CLINICAL DATA:  Headache since yesterday. EXAM: CT HEAD WITHOUT CONTRAST TECHNIQUE: Contiguous axial images were obtained from the base of the skull through the vertex without intravenous contrast. COMPARISON:  None. FINDINGS: Brain: No evidence of acute infarction, hemorrhage, extra-axial collection, ventriculomegaly, or mass effect. Brain volume normal for age. Vascular: No hyperdense vessel or unexpected calcification. Skull: Negative for fracture or focal lesion. Sinuses/Orbits: No acute findings. Other: None. IMPRESSION: No acute intracranial abnormality. Electronically Signed   By: Rubye OaksMelanie  Ehinger M.D.   On: 06/01/2015 05:55   I have personally reviewed and evaluated these images and lab results as part of my medical decision-making.   EKG Interpretation None      MDM   Final diagnoses:  Nonintractable episodic headache, unspecified headache type    Patient presents with headache. Daily over the last week but reports 6 month history of frequent headaches. Has not seen a doctor. Usually improves with Advil. Denies any fever. Noted to be 99.4 care prior to arrival. No other infectious symptoms. No neck stiffness. Doubt meningitis. Given the chronicity of symptoms, doubt subarachnoid hemorrhage. He is nonfocal. Will obtain CT scan to rule out mass. He was given a migraine cocktail. On repeat evaluation, patient reports  pain is now 4 out of 10. He remains nonfocal. Lab work and CT is negative. Given recurrence of headaches, will discharge home with neurology follow-up.  After history, exam, and medical workup I feel the patient has been appropriately medically screened and is safe for discharge home. Pertinent diagnoses were discussed with the patient. Patient was given return precautions.  I personally performed the services described in this documentation, which was scribed in my presence. The recorded information has been reviewed and is accurate.    Shon Batonourtney F Chauna Osoria, MD 06/01/15 0630

## 2016-03-28 ENCOUNTER — Encounter (HOSPITAL_COMMUNITY): Payer: Self-pay | Admitting: Emergency Medicine

## 2016-03-28 ENCOUNTER — Ambulatory Visit (HOSPITAL_COMMUNITY)
Admission: EM | Admit: 2016-03-28 | Discharge: 2016-03-28 | Disposition: A | Discharge status: Home / Self Care | Payer: Self-pay | Attending: Family Medicine | Admitting: Family Medicine

## 2016-03-28 DIAGNOSIS — E119 Type 2 diabetes mellitus without complications: Secondary | ICD-10-CM | POA: Insufficient documentation

## 2016-03-28 DIAGNOSIS — R51 Headache: Secondary | ICD-10-CM

## 2016-03-28 DIAGNOSIS — Z131 Encounter for screening for diabetes mellitus: Secondary | ICD-10-CM

## 2016-03-28 DIAGNOSIS — R519 Headache, unspecified: Secondary | ICD-10-CM

## 2016-03-28 DIAGNOSIS — Z7984 Long term (current) use of oral hypoglycemic drugs: Secondary | ICD-10-CM | POA: Insufficient documentation

## 2016-03-28 LAB — CBC
HCT: 44.3 % (ref 39.0–52.0)
Hemoglobin: 15 g/dL (ref 13.0–17.0)
MCH: 30.5 pg (ref 26.0–34.0)
MCHC: 33.9 g/dL (ref 30.0–36.0)
MCV: 90.2 fL (ref 78.0–100.0)
Platelets: 189 10*3/uL (ref 150–400)
RBC: 4.91 MIL/uL (ref 4.22–5.81)
RDW: 12.8 % (ref 11.5–15.5)
WBC: 12.1 10*3/uL — ABNORMAL HIGH (ref 4.0–10.5)

## 2016-03-28 LAB — SEDIMENTATION RATE: Sed Rate: 5 mm/hr (ref 0–16)

## 2016-03-28 LAB — GLUCOSE, CAPILLARY: GLUCOSE-CAPILLARY: 122 mg/dL — AB (ref 65–99)

## 2016-03-28 MED ORDER — BUTALBITAL-APAP-CAFFEINE 50-325-40 MG PO TABS: 1.0000 | ORAL_TABLET | Freq: Four times a day (QID) | ORAL | 0 refills | Status: DC | PRN

## 2016-03-28 NOTE — ED Provider Notes (Signed)
CSN: [id number]     Arrival date & time 03/28/16  1521 History   None    Chief Complaint  Patient presents with  . Fever   (Consider location/radiation/quality/duration/timing/severity/associated sxs/prior Treatment)  SDA for Headache   Onset: Has headache which started about 3 days ago, patient states he has been feeling generally weak. Slight temporal pain noted on the right side. When he has a headache he sometimes has blurry vision with it. Patient has had a headache intermittently for about 3 days. He notes moderate pain associated with this headache. He denies any photophobia or phonophobia. He indicates having several years of headaches usually they come and go lasting for about 3 hours. However this one has now stayed for about 3 days Site/Radition of Pain: Temporal, right side Temporal Pain: yes  Presence of Aura: None   ROS:  Fever/Weight loss: None  Neurologic Impairment: None  Sudden onset (age > 40) : No, has had headache similarly in the past with negative head CT  Headache awakening from sleep: No Nausea/Vomitting: No Worsening Progression of Headache: Indicates worsening progression over the last 3 dayas   Trauma: No         Past Medical History:  Diagnosis Date  . Diabetes mellitus without complication [facility])    Past Surgical History:  Procedure Laterality Date  . SHOULDER SURGERY     No family history on file. Social History  Substance Use Topics  . Smoking status: Never Smoker  . Smokeless tobacco: Not on file  . Alcohol use No    Review of Systems  Constitutional: Positive for fatigue. Negative for chills and fever.  HENT: Negative for congestion and sore throat.   Eyes: Negative for photophobia and discharge.  Respiratory: Negative for cough and shortness of breath.   Cardiovascular: Negative for chest pain and palpitations.  Gastrointestinal: Negative for nausea and vomiting.  Neurological: Positive for headaches. Negative for dizziness,  syncope and numbness.    Allergies  Patient has no known allergies.  Home Medications   Prior to Admission medications   Medication Sig Start Date End Date Taking? Authorizing Provider  butalbital-acetaminophen-caffeine (FIORICET, ESGIC) [phone] MG tablet Take 1-2 tablets by mouth every 6 (six) hours as needed for headache. 03/28/16 03/28/17  Rafeef Lau Cletis Media, MD  metFORMIN (GLUCOPHAGE) 500 MG tablet Take 500 mg by mouth 2 (two) times daily. 05/17/15   Historical Provider, MD  omeprazole (PRILOSEC) 20 MG capsule Take 20 mg by mouth 2 (two) times daily. 04/22/15   Historical Provider, MD  tiZANidine (ZANAFLEX) 4 MG tablet Take 4 mg by mouth 2 (two) times daily as needed for muscle spasms.  04/15/15   Historical Provider, MD  traMADol (ULTRAM) 50 MG tablet Take 50 mg by mouth 2 (two) times daily as needed. 04/15/15   Historical Provider, MD   Meds Ordered and Administered this Visit  Medications - No data to display  BP 121/73 (BP Location: Left Arm)   Pulse 96   Temp 98.2 F (36.8 C) (Oral)   Resp (!) 22   SpO2 99%  No data found.   Physical Exam  Constitutional: He is oriented to person, place, and time. He appears well-developed and well-nourished.  HENT:  Head: Normocephalic and atraumatic.  Nose: Nose normal.  Positive pain on palpation of temporal area  Eyes: Conjunctivae and EOM are normal. Pupils are equal, round, and reactive to light.  Neck: Normal range of motion. Neck supple.  Cardiovascular: Normal rate, regular rhythm, normal heart sounds  and intact distal pulses.   Pulmonary/Chest: Effort normal and breath sounds normal.  Abdominal: Soft. Bowel sounds are normal.  Musculoskeletal: Normal range of motion.  Neurological: He is alert and oriented to person, place, and time. He displays normal reflexes. No cranial nerve deficit or sensory deficit. Coordination normal.  Skin: Skin is warm.    Urgent Care Course     Procedures (including critical care time)  Labs  Review Labs Reviewed  GLUCOSE, CAPILLARY - Abnormal; Notable for the following:       Result Value   Glucose-Capillary 122 (*)    All other components within normal limits  SEDIMENTATION RATE  CBC    Imaging Review No results found.     MDM   1. Nonintractable headache, unspecified chronicity pattern, unspecified headache type    Due to pain on temporal area will get ESR and CBC to rule out temporal arteritis. Likely tension-type headache with no red flags patient with a chronic history of headaches Will  provide Fioricet for headache  Return precautions discussed    Taygan Connell Cletis Media, MD 03/28/16 1635

## 2016-03-28 NOTE — ED Triage Notes (Signed)
Fever and headache for 3 days

## 2016-03-28 NOTE — Discharge Instructions (Signed)
Please take Fioricet as prescribed for your headache. If it does not improve with this please return for reevaluation at the emergency room. If you have any weakness or changes in sensation please return to the ED. If you develop worsening headache please return to the ED. Please follow-up with your primary care physician as discussed regarding your diabetes. We will let you know the results of your blood work

## 2016-03-29 ENCOUNTER — Telehealth: Payer: Self-pay | Admitting: Internal Medicine

## 2016-03-29 NOTE — Telephone Encounter (Signed)
Called patient leaving a message letting him know that his ESR was negative. Patient follow up with PCP as needed.

## 2016-03-29 NOTE — Telephone Encounter (Signed)
-----   Message from Eustace MooreLaura W Murray, MD sent at 03/28/2016 11:10 PM EDT ----- Clinical staff please let patient know that lab results do not suggest temporal arteritis.  Recheck or followup with PCP for further evaluation if symptoms are not improving.  LM

## 2016-09-29 ENCOUNTER — Ambulatory Visit (HOSPITAL_COMMUNITY)
Admission: EM | Admit: 2016-09-29 | Discharge: 2016-09-29 | Disposition: A | Discharge status: Home / Self Care | Payer: 59 | Attending: Internal Medicine | Admitting: Internal Medicine

## 2016-09-29 ENCOUNTER — Encounter (HOSPITAL_COMMUNITY): Payer: Self-pay | Admitting: Emergency Medicine

## 2016-09-29 DIAGNOSIS — R0789 Other chest pain: Secondary | ICD-10-CM

## 2016-09-29 DIAGNOSIS — B353 Tinea pedis: Secondary | ICD-10-CM | POA: Diagnosis not present

## 2016-09-29 MED ORDER — IPRATROPIUM-ALBUTEROL 0.5-2.5 (3) MG/3ML IN SOLN
3.0000 mL | Freq: Once | RESPIRATORY_TRACT | Status: AC
  Administered 2016-09-29: 3 mL via RESPIRATORY_TRACT

## 2016-09-29 MED ORDER — KETOROLAC TROMETHAMINE 60 MG/2ML IM SOLN
60.0000 mg | Freq: Once | INTRAMUSCULAR | Status: AC
  Administered 2016-09-29: 60 mg via INTRAMUSCULAR

## 2016-09-29 MED ORDER — IPRATROPIUM-ALBUTEROL 0.5-2.5 (3) MG/3ML IN SOLN
RESPIRATORY_TRACT | Status: AC
  Filled 2016-09-29: qty 3

## 2016-09-29 MED ORDER — KETOCONAZOLE 2 % EX CREA: 1.0000 "application " | TOPICAL_CREAM | Freq: Every day | CUTANEOUS | 0 refills | Status: AC

## 2016-09-29 MED ORDER — KETOROLAC TROMETHAMINE 60 MG/2ML IM SOLN
INTRAMUSCULAR | Status: AC
  Filled 2016-09-29: qty 2

## 2016-09-29 NOTE — Discharge Instructions (Signed)
Right-sided chest discomfort could have many possible causes, including heart disease. ECG today was without specific abnormality but this does not rule out heart disease.   I recommend further evaluation in the emergency room for your chest pain. A breathing treatment was given in the urgent care, without resolving the discomfort.  An injection of ketorolac (anti inflammatory/pain reliever) was also given.   Scaling and blisters on feet are suggest a foot fungus infection. Prescription for ketoconazole cream was sent to the pharmacy.

## 2016-09-29 NOTE — ED Provider Notes (Signed)
[facility]    CSN: [id number] Arrival date & time: 09/29/16  1018     History   Chief Complaint Chief Complaint  Patient presents with  . Chest Pain    right    HPI Travis Mendoza is a 76 y.o. male. He presents today with 2-3 day history of right-sided chest discomfort. There is some worsening with movement of the shoulder, and some worsening with deep breath. He feels a little bit breathless. He is not coughing. No fever. No unusual activities, has been doing the same job at work and does not have to lift lift more than about 20 pounds. No prior history of this symptom.    HPI  Past Medical History:  Diagnosis Date  . Diabetes mellitus without complication [facility])     Patient Active Problem List   Diagnosis Date Noted  . 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 HYPERTHYROIDISM 04/01/2008  . SPONDYLOSIS, CERVICAL 04/01/2008    Past Surgical History:  Procedure Laterality Date  . SHOULDER SURGERY         Home Medications    Prior to Admission medications   Medication Sig Start Date End Date Taking? Authorizing Provider  butalbital-acetaminophen-caffeine (FIORICET, ESGIC) [phone] MG tablet Take 1-2 tablets by mouth every 6 (six) hours as needed for headache. 03/28/16 03/28/17 Yes Mikell, Antionette Poles, MD  metFORMIN (GLUCOPHAGE) 500 MG tablet Take 500 mg by mouth 2 (two) times daily. 05/17/15  Yes [provider]  omeprazole (PRILOSEC) 20 MG capsule Take 20 mg by mouth 2 (two) times daily. 04/22/15  Yes [provider]  tiZANidine (ZANAFLEX) 4 MG tablet Take 4 mg by mouth 2 (two) times daily as needed for muscle spasms.  04/15/15  Yes [provider]  traMADol (ULTRAM) 50 MG tablet Take 50 mg by mouth 2 (two) times daily as needed. 04/15/15  Yes [provider]  ketoconazole (NIZORAL) 2 % cream Apply 1 application topically daily. 09/29/16   Eustace Moore, MD    Family History History reviewed. No pertinent family  history.  Social History Social History  Substance Use Topics  . Smoking status: Never Smoker  . Smokeless tobacco: Never Used  . Alcohol use No     Allergies   Patient has no known allergies.   Review of Systems Review of Systems  All other systems reviewed and are negative.    Physical Exam Triage Vital Signs ED Triage Vitals [09/29/16 1046]  Enc Vitals Group     BP 129/76     Pulse Rate 72     Resp (!) 42     Temp 97.9 F (36.6 C)     Temp Source Oral     SpO2 100 %     Weight      Height      Pain Score      Pain Loc    Updated Vital Signs BP 129/76 (BP Location: Right Arm)   Pulse 72   Temp 97.9 F (36.6 C) (Oral)   Resp (!) 42   SpO2 100%   Physical Exam  Constitutional: He is oriented to person, place, and time. No distress.  Alert, nicely groomed  HENT:  Head: Atraumatic.  Eyes:  Conjugate gaze, no eye redness/drainage  Neck: Neck supple.  Cardiovascular: Normal rate and regular rhythm.   Systolic murmur  Pulmonary/Chest: No respiratory distress. He has no wheezes. He has no rales.  Lungs clear, symmetric breath sounds  Abdominal: He exhibits no distension.  Musculoskeletal:  Normal range of motion.  1+ symmetric pitting edema bilaterally Full range of motion at the right shoulder, some discomfort with internal rotation at the shoulder  Neurological: He is alert and oriented to person, place, and time.  Skin: Skin is warm and dry.  No cyanosis 2 bullous lesions on the lateral aspect of the left foot Scaling on the lateral aspect of both feet fissuring and maceration between all of the toes  Nursing note and vitals reviewed.    UC Treatments / Results   EKG NSR, no acute ST changes, non specific ST changes present.     Procedures Procedures (including critical care time)  Medications Ordered in UC Medications  ketorolac (TORADOL) injection 60 mg (not administered)  ipratropium-albuterol (DUONEB) 0.5-2.5 (3) MG/3ML nebulizer  solution 3 mL (3 mLs Nebulization Given 09/29/16 1143)     Final Clinical Impressions(s) / UC Diagnoses   Final diagnoses:  Atypical chest pain  Tinea pedis of both feet   Right-sided chest discomfort could have many possible causes, including heart disease. ECG today was without specific abnormality but this does not rule out heart disease.   I recommend further evaluation in the emergency room for your chest pain. A breathing treatment was given in the urgent care, without resolving the discomfort.  An injection of ketorolac (anti inflammatory/pain reliever) was also given.   Scaling and blisters on feet are suggest a foot fungus infection. Prescription for ketoconazole cream was sent to the pharmacy.  New Prescriptions New Prescriptions   KETOCONAZOLE (NIZORAL) 2 % CREAM    Apply 1 application topically daily.     Controlled Substance Prescriptions [place] Controlled Substance Registry consulted? No   Eustace Moore, MD 09/29/16 1212

## 2016-09-29 NOTE — ED Triage Notes (Signed)
Pt complains of constant right sided chest pain that he states is in the axillary area and radiates down his right side to below his breast.  The pain is reproducible.  He states leaning on his right arm, or using the arm makes the pain worse, and holding the area with his left hand makes it feel a little better.  Pt takes Zanaflex and Tramadol, but has not taken any today.  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

## 2017-02-08 ENCOUNTER — Emergency Department (HOSPITAL_COMMUNITY)
Admission: EM | Admit: 2017-02-08 | Discharge: 2017-02-09 | Disposition: A | Discharge status: Home / Self Care | Payer: 59 | Attending: Emergency Medicine | Admitting: Emergency Medicine

## 2017-02-08 ENCOUNTER — Emergency Department (HOSPITAL_COMMUNITY): Payer: 59

## 2017-02-08 ENCOUNTER — Encounter (HOSPITAL_COMMUNITY): Payer: Self-pay | Admitting: Emergency Medicine

## 2017-02-08 DIAGNOSIS — E119 Type 2 diabetes mellitus without complications: Secondary | ICD-10-CM | POA: Insufficient documentation

## 2017-02-08 DIAGNOSIS — R112 Nausea with vomiting, unspecified: Secondary | ICD-10-CM | POA: Diagnosis present

## 2017-02-08 DIAGNOSIS — Z7984 Long term (current) use of oral hypoglycemic drugs: Secondary | ICD-10-CM | POA: Insufficient documentation

## 2017-02-08 DIAGNOSIS — R509 Fever, unspecified: Secondary | ICD-10-CM | POA: Insufficient documentation

## 2017-02-08 DIAGNOSIS — R197 Diarrhea, unspecified: Secondary | ICD-10-CM | POA: Diagnosis not present

## 2017-02-08 DIAGNOSIS — Z79899 Other long term (current) drug therapy: Secondary | ICD-10-CM | POA: Insufficient documentation

## 2017-02-08 DIAGNOSIS — R0602 Shortness of breath: Secondary | ICD-10-CM | POA: Insufficient documentation

## 2017-02-08 LAB — URINALYSIS, ROUTINE W REFLEX MICROSCOPIC
Bilirubin Urine: NEGATIVE
Glucose, UA: >=500 mg/dL — AB
Hgb urine dipstick: NEGATIVE
Ketones, ur: 5 mg/dL — AB
Leukocytes, UA: NEGATIVE
Nitrite: NEGATIVE
Protein, ur: NEGATIVE mg/dL
Specific Gravity, Urine: 1.027 (ref 1.005–1.030)
Squamous Epithelial / LPF: NONE SEEN
pH: 7 (ref 5.0–8.0)

## 2017-02-08 LAB — COMPREHENSIVE METABOLIC PANEL
ALT: 21 U/L (ref 17–63)
AST: 22 U/L (ref 15–41)
Albumin: 4.6 g/dL (ref 3.5–5.0)
Alkaline Phosphatase: 61 U/L (ref 38–126)
Anion gap: 15 (ref 5–15)
BUN: 23 mg/dL — ABNORMAL HIGH (ref 6–20)
CO2: 23 mmol/L (ref 22–32)
Calcium: 10 mg/dL (ref 8.9–10.3)
Chloride: 102 mmol/L (ref 101–111)
Creatinine, Ser: 0.87 mg/dL (ref 0.61–1.24)
GFR calc Af Amer: >60 mL/min (ref >60)
GFR calc non Af Amer: >60 mL/min (ref >60)
Glucose, Bld: 124 mg/dL — ABNORMAL HIGH (ref 65–99)
Potassium: 3.4 mmol/L — ABNORMAL LOW (ref 3.5–5.1)
Sodium: 140 mmol/L (ref 135–145)
Total Bilirubin: 1.3 mg/dL — ABNORMAL HIGH (ref 0.3–1.2)
Total Protein: 8.2 g/dL — ABNORMAL HIGH (ref 6.5–8.1)

## 2017-02-08 LAB — PROTIME-INR
INR: 1.02
Prothrombin Time: 13.3 seconds (ref 11.4–15.2)

## 2017-02-08 LAB — CBC WITH DIFFERENTIAL/PLATELET
Basophils Absolute: 0 10*3/uL (ref 0.0–0.1)
Basophils Relative: 0 %
Eosinophils Absolute: 0.2 10*3/uL (ref 0.0–0.7)
Eosinophils Relative: 1 %
HCT: 47.4 % (ref 39.0–52.0)
Hemoglobin: 16.1 g/dL (ref 13.0–17.0)
Lymphocytes Relative: 15 %
Lymphs Abs: 2.1 10*3/uL (ref 0.7–4.0)
MCH: 28.4 pg (ref 26.0–34.0)
MCHC: 34 g/dL (ref 30.0–36.0)
MCV: 83.7 fL (ref 78.0–100.0)
Monocytes Absolute: 1.2 10*3/uL — ABNORMAL HIGH (ref 0.1–1.0)
Monocytes Relative: 9 %
Neutro Abs: 10 10*3/uL — ABNORMAL HIGH (ref 1.7–7.7)
Neutrophils Relative %: 75 %
Platelets: 199 10*3/uL (ref 150–400)
RBC: 5.66 MIL/uL (ref 4.22–5.81)
RDW: 13 % (ref 11.5–15.5)
WBC: 13.4 10*3/uL — ABNORMAL HIGH (ref 4.0–10.5)

## 2017-02-08 LAB — I-STAT CG4 LACTIC ACID, ED
Lactic Acid, Venous: 1.88 mmol/L (ref 0.5–1.9)
Lactic Acid, Venous: 1.18 mmol/L (ref 0.5–1.9)

## 2017-02-08 LAB — LIPASE, BLOOD: Lipase: 35 U/L (ref 11–51)

## 2017-02-08 LAB — I-STAT TROPONIN, ED: TROPONIN I, POC: 0 ng/mL (ref 0.00–0.08)

## 2017-02-08 MED ORDER — SODIUM CHLORIDE 0.9 % IV BOLUS (SEPSIS)
1000.0000 mL | Freq: Once | INTRAVENOUS | Status: AC
  Administered 2017-02-08: 1000 mL via INTRAVENOUS

## 2017-02-08 MED ORDER — MORPHINE SULFATE (PF) 4 MG/ML IV SOLN
4.0000 mg | Freq: Once | INTRAVENOUS | Status: AC
  Administered 2017-02-08: 4 mg via INTRAVENOUS
  Filled 2017-02-08: qty 1

## 2017-02-08 MED ORDER — IOPAMIDOL (ISOVUE-300) INJECTION 61%
INTRAVENOUS | Status: AC
  Administered 2017-02-08: 100 mL
  Filled 2017-02-08: qty 100

## 2017-02-08 NOTE — ED Provider Notes (Signed)
[facility] EMERGENCY DEPARTMENT Provider Note   CSN: [id number] Arrival date & time: 02/08/17  2033     History   Chief Complaint Chief Complaint  Patient presents with  . Emesis    HPI Travis Mendoza is a 77 y.o. male.  HPI   77 year old male with history of H. pylori, hypothyroidism, non-insulin-dependent diabetes presenting for evaluation of nausea vomiting diarrhea.  Patient speaks Arabic, history obtained through family member who is at bedside.  Patient is aware that we have available translator but prefers his family members instead.  Patient developed upper abdominal pain that started earlier today.  Pain is described as achy, waxing waning which has been recurrent for the past 3 weeks.  However, today he endorsed vomiting 5 times of nonbloody nonbilious contents as well as having 5 episodes of nonbloody nonbilious diarrhea.  He is unable to keep anything down.  He also endorses a throbbing frontal headache, and states whenever he vomits he also experienced some shortness of breath.  He does endorse some subjective fever, chills, throat irritation and decreased hearing in left ear.  He denies having any  pleuritic chest pain, back pain, dysuria, or rash.  No trouble with urination.      Past Medical History:  Diagnosis Date  . Diabetes mellitus without complication [facility])     Patient Active Problem List   Diagnosis Date Noted  . 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 HYPERTHYROIDISM 04/01/2008  . SPONDYLOSIS, CERVICAL 04/01/2008    Past Surgical History:  Procedure Laterality Date  . SHOULDER SURGERY         Home Medications    Prior to Admission medications   Medication Sig Start Date End Date Taking? Authorizing Provider  butalbital-acetaminophen-caffeine (FIORICET, ESGIC) [phone] MG tablet Take 1-2 tablets by mouth every 6 (six) hours as needed for headache. 03/28/16 03/28/17  Berton Bon, MD  ketoconazole (NIZORAL) 2 % cream  Apply 1 application topically daily. 09/29/16   Isa Rankin, MD  metFORMIN (GLUCOPHAGE) 500 MG tablet Take 500 mg by mouth 2 (two) times daily. 05/17/15   [provider]  omeprazole (PRILOSEC) 20 MG capsule Take 20 mg by mouth 2 (two) times daily. 04/22/15   [provider]  tiZANidine (ZANAFLEX) 4 MG tablet Take 4 mg by mouth 2 (two) times daily as needed for muscle spasms.  04/15/15   [provider]  traMADol (ULTRAM) 50 MG tablet Take 50 mg by mouth 2 (two) times daily as needed. 04/15/15   [provider]    Family History No family history on file.  Social History Social History   Tobacco Use  . Smoking status: Never Smoker  . Smokeless tobacco: Never Used  Substance Use Topics  . Alcohol use: No  . Drug use: No     Allergies   Patient has no known allergies.   Review of Systems Review of Systems  All other systems reviewed and are negative.    Physical Exam Updated Vital Signs BP (!) 142/76 (BP Location: Right Arm)   Pulse (!) 122   Temp 98 F (36.7 C) (Oral)   Resp 20   Ht 5\' 9"  (1.753 m)   Wt 90.7 kg (200 lb)   SpO2 97%   BMI 29.53 kg/m   Physical Exam  Constitutional: He is oriented to person, place, and time. He appears well-developed and well-nourished. No distress.  Patient in no acute discomfort.  HENT:  Head: Atraumatic.  Eyes: Conjunctivae  and EOM are normal. Pupils are equal, round, and reactive to light.  Neck: Normal range of motion. Neck supple.  No nuchal rigidity  Cardiovascular: Normal rate and regular rhythm.  Pulmonary/Chest: Effort normal and breath sounds normal. No stridor. No respiratory distress. He has no wheezes. He has no rales. He exhibits no tenderness.  Abdominal: Soft. Bowel sounds are normal. He exhibits no distension. There is tenderness (Tenderness to upper abdomen including right upper quadrant, epigastric and left upper quadrant on palpation without guarding or rebound tenderness.).    Musculoskeletal: He exhibits no edema.  Neurological: He is alert and oriented to person, place, and time. He has normal strength. No cranial nerve deficit or sensory deficit. GCS eye subscore is 4. GCS verbal subscore is 5. GCS motor subscore is 6.  Skin: No rash noted.  Psychiatric: He has a normal mood and affect.  Nursing note and vitals reviewed.    ED Treatments / Results  Labs (all labs ordered are listed, but only abnormal results are displayed) Labs Reviewed  COMPREHENSIVE METABOLIC PANEL - Abnormal; Notable for the following components:      Result Value   Potassium 3.4 (*)    Glucose, Bld 124 (*)    BUN 23 (*)    Total Protein 8.2 (*)    Total Bilirubin 1.3 (*)    All other components within normal limits  CBC WITH DIFFERENTIAL/PLATELET - Abnormal; Notable for the following components:   WBC 13.4 (*)    Neutro Abs 10.0 (*)    Monocytes Absolute 1.2 (*)    All other components within normal limits  URINALYSIS, ROUTINE W REFLEX MICROSCOPIC - Abnormal; Notable for the following components:   Glucose, UA >=500 (*)    Ketones, ur 5 (*)    Bacteria, UA RARE (*)    All other components within normal limits  D-DIMER, QUANTITATIVE (NOT AT [facility]) - Abnormal; Notable for the following components:   D-Dimer, Quant 1.46 (*)    All other components within normal limits  CULTURE, BLOOD (ROUTINE X 2)  CULTURE, BLOOD (ROUTINE X 2)  PROTIME-INR  LIPASE, BLOOD  I-STAT CG4 LACTIC ACID, ED  I-STAT TROPONIN, ED  I-STAT CG4 LACTIC ACID, ED    EKG  EKG Interpretation  Date/Time:  Thursday February 08 2017 20:39:34 EST Ventricular Rate:  119 PR Interval:    QRS Duration: 86 QT Interval:  454 QTC Calculation: 638 R Axis:   32 Text Interpretation:  Critical Test Result: Long QTc Accelerated Junctional rhythm Prolonged QT Abnormal ECG When compared to prior, longer QTC.  No STEMI Confirmed by Theda Belfast ([id number]) on 02/08/2017 9:59:52 PM       Radiology Dg Chest 2  View  Result Date: 02/08/2017 CLINICAL DATA:  Fever, shortness of breath, and cough. Nausea, vomiting, and diarrhea. One day history. EXAM: CHEST  2 VIEW COMPARISON:  02/08/2012 FINDINGS: Shallow inspiration with linear atelectasis or fibrosis in the lung bases. Heart size and pulmonary vascularity are normal. No airspace disease or consolidation in the lungs. No blunting of costophrenic angles. No pneumothorax. Mediastinal contours appear intact. Degenerative changes in the spine. IMPRESSION: Shallow inspiration with linear atelectasis or fibrosis in the lung bases. No focal consolidation. Electronically Signed   By: Burman Nieves M.D.   On: 02/08/2017 21:23   Ct Angio Chest Pe W And/or Wo Contrast  Result Date: 02/09/2017 CLINICAL DATA:  Chest pain. Positive D-dimer. PE suspected, intermediate probability. EXAM: CT ANGIOGRAPHY CHEST WITH CONTRAST TECHNIQUE: Multidetector CT imaging of  the chest was performed using the standard protocol during bolus administration of intravenous contrast. Multiplanar CT image reconstructions and MIPs were obtained to evaluate the vascular anatomy. CONTRAST:  <See Chart> ISOVUE-370 IOPAMIDOL (ISOVUE-370) INJECTION 76% COMPARISON:  None. FINDINGS: Cardiovascular: There is no pulmonary embolism identified within the main, lobar or segmental pulmonary arteries bilaterally. Heart size is normal. No pericardial effusion. Thoracic aorta is normal in caliber and configuration. No evidence of aortic dissection. Mediastinum/Nodes: No mass or enlarged lymph nodes within the mediastinum or perihilar regions. 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 Esophagus appears normal. Trachea and central bronchi are unremarkable. Lungs/Pleura: Lungs are clear.  No pleural effusion or pneumothorax. Upper Abdomen: Limited images of the upper abdomen are unremarkable. Musculoskeletal: No chest wall abnormality.  No acute or significant osseous findings. Review of the MIP images confirms the above findings. IMPRESSION: 1. No acute findings. No pulmonary embolism identified. Lungs are clear. 2. 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 Electronically Signed   By: Bary RichardStan  Maynard M.D.   On: 02/09/2017 00:57   Ct Abdomen Pelvis W Contrast  Result Date: 02/08/2017 CLINICAL DATA:  77 y/o M; 1 day of upper abdominal pain with nausea and vomiting. EXAM: CT ABDOMEN AND PELVIS WITH CONTRAST TECHNIQUE: Multidetector CT imaging of the abdomen and pelvis was performed using the standard protocol following bolus administration of intravenous contrast. CONTRAST:  100 cc Isovue-300 COMPARISON:  09/03/2014 CT abdomen and pelvis FINDINGS: Lower chest: Stable 4 mm left lower lobe pulmonary nodule compatible with benign etiology (series 5, image 18). Hepatobiliary: Hepatic steatosis. No focal liver abnormality is seen. No gallstones, gallbladder wall thickening, or biliary dilatation. Pancreas: Unremarkable. No pancreatic ductal dilatation or surrounding inflammatory changes. Spleen: Normal in size without focal abnormality. Adrenals/Urinary Tract: Adrenal glands are unremarkable. Subcentimeter bilateral kidney cysts. Kidneys are otherwise normal, without renal calculi, focal lesion, or hydronephrosis. Bladder is unremarkable. Stomach/Bowel: Stomach is within normal limits. Appendix appears normal. No evidence of bowel wall thickening, distention, or inflammatory changes. Sigmoid diverticulosis. Vascular/Lymphatic: No significant vascular findings are present. No enlarged abdominal or pelvic lymph nodes. Reproductive: Prostate is unremarkable. Other: No abdominal wall hernia or abnormality. No abdominopelvic ascites. Musculoskeletal: No acute or significant osseous findings. Mild lumbar spondylosis prominent lower lumbar facet arthrosis. IMPRESSION: 1. No acute process identified. 2. Hepatic  steatosis. 3. Sigmoid diverticulosis without findings of diverticulitis. Electronically Signed   By: Mitzi HansenLance  Furusawa-Stratton M.D.   On: 02/08/2017 23:36    Procedures Procedures (including critical care time)  Medications Ordered in ED Medications  morphine 4 MG/ML injection 4 mg (4 mg Intravenous Given 02/08/17 2237)  sodium chloride 0.9 % bolus 1,000 mL (0 mLs Intravenous Stopped 02/09/17 0000)  iopamidol (ISOVUE-300) 61 % injection (100 mLs  Contrast Given 02/08/17 2305)  iopamidol (ISOVUE-370) 76 % injection (100 mLs  Contrast Given 02/09/17 0032)     Initial Impression / Assessment and Plan / ED Course  I have reviewed the triage vital signs and the nursing notes.  Pertinent labs & imaging results that were available during my care of the patient were reviewed by me and considered in my medical decision making (see chart for details).     BP 123/79   Pulse 85   Temp 98 F (36.7 C) (Oral)   Resp 16   Ht 5\' 9"  (1.753 m)   Wt 90.7 kg (200 lb)   SpO2 99%   BMI 29.53 kg/m  Final Clinical Impressions(s) / ED Diagnoses   Final diagnoses:  Nausea vomiting and diarrhea    ED Discharge Orders        Ordered    omeprazole (PRILOSEC) 20 MG capsule  2 times daily before meals     02/09/17 0058    ranitidine (ZANTAC) 150 MG capsule  2 times daily     02/09/17 0058    butalbital-acetaminophen-caffeine (FIORICET, ESGIC) 50-325-40 MG tablet  Every 6 hours PRN     02/09/17 0058     10:26 PM Patient here with upper abdominal pain with associated nausea vomiting and diarrhea.  He also endorses headache, and have some shortness of breath and the pain is intense.  He has no focal neuro deficit to suggest or nuchal rigidity concerning for meningitis or stroke.  11:47 PM Patient has elevated white count 13.4.  Electrolytes panels are reassuring.  Initial troponin is normal, lactic acid is within normal limits, lipase is normal, urine without signs of urinary tract infection, chest  x-ray unremarkable, abdominal pelvis CT scan without acute pathology.  Patient however  Initially was tachycardic with a heart rate of 122, he has been tachypneic with a respiratory rate between 25-30.  He did report of having some shortness of breath therefore a d-dimer will be obtained.  Care discussed with Dr. Rush Landmark.   12:46 AM D-dimer elevated at 1.46.  Will obtain chest CTA to r/o PE.  Otherwise pt report his sxs improving.  If CTA neg, and pt felt better, anticipate discharging home.    1:09 AM Chest CTA shows no evidence of PE. 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  Pt able to swallow without difficulty.  Pt does need to f/u closely with his PCP for further evaluation of his thyroid condition since he's not on any thyroid medication currently.  No evidence to suggest thyrotoxicosis at this time.  EKG without evidence of afib, and clinically pt doesn't appear to be in CHF.  Pt felt better after receiving treatment.  Pt will f/u with his PCP.  Return precaution given.    Fayrene Helper, PA-C 02/09/17 1610    Tegeler, Canary Brim, MD 02/09/17 [phone]

## 2017-02-08 NOTE — ED Triage Notes (Signed)
Pt reports emesis, diarrhea, SOB X1 day. Trey PaulaJeff PA at bedside to eval pt. Tachycardic in triage.

## 2017-02-08 NOTE — ED Notes (Signed)
Patient transported to CT 

## 2017-02-08 NOTE — ED Provider Notes (Signed)
Patient placed in Quick Look pathway, seen and evaluated   Chief Complaint: Fever shortness of breath  HPI:   She is here with 1 day history of fever and shortness of breath and cough.  Patient additionally notes nausea vomiting and diarrhea.  Patient denies any dysuria.  Temperature was not taken at home.  ROS: Fever (one)  Physical Exam:   Vitals:   02/08/17 2041  BP: (!) 142/76  Pulse: (!) 122  Resp: 20  Temp: 98 F (36.7 C)  SpO2: 97%     Gen: No distress  Neuro: Awake and Alert  Skin: Warm    Focused Exam: Lung sounds clear bilateral   Initiation of care has begun. The patient has been counseled on the process, plan, and necessity for staying for the completion/evaluation, and the remainder of the medical screening examination    Rosalio LoudHedges, Haleemah Buckalew, PA-C 02/08/17 2042    Phillis HaggisMabe, Martha L, MD 02/08/17 2055

## 2017-02-09 ENCOUNTER — Encounter (HOSPITAL_COMMUNITY): Payer: Self-pay | Admitting: Radiology

## 2017-02-09 ENCOUNTER — Emergency Department (HOSPITAL_COMMUNITY): Payer: 59

## 2017-02-09 LAB — D-DIMER, QUANTITATIVE (NOT AT ARMC): D DIMER QUANT: 1.46 ug{FEU}/mL — AB (ref 0.00–0.50)

## 2017-02-09 MED ORDER — BUTALBITAL-APAP-CAFFEINE 50-325-40 MG PO TABS: 1.0000 | ORAL_TABLET | Freq: Four times a day (QID) | ORAL | 0 refills | Status: AC | PRN

## 2017-02-09 MED ORDER — RANITIDINE HCL 150 MG PO CAPS: 150.0000 mg | ORAL_CAPSULE | Freq: Two times a day (BID) | ORAL | 0 refills | Status: AC

## 2017-02-09 MED ORDER — OMEPRAZOLE 20 MG PO CPDR: 20.0000 mg | DELAYED_RELEASE_CAPSULE | Freq: Two times a day (BID) | ORAL | 0 refills | Status: AC

## 2017-02-09 MED ORDER — IOPAMIDOL (ISOVUE-370) INJECTION 76%
INTRAVENOUS | Status: AC
  Administered 2017-02-09: 100 mL
  Filled 2017-02-09: qty 100

## 2017-02-09 NOTE — ED Notes (Signed)
Patient transported to CT 

## 2017-02-09 NOTE — Discharge Instructions (Signed)
Please take fioricet as needed for your headache.  Take prilosec and zantac 30 minutes before each major meal to help with your abdominal pain.  Follow up with your doctor for further evaluation of your condition. Return if you have any concerns.

## 2017-02-09 NOTE — ED Notes (Signed)
Pt departed in NAD, refused use of wheelchair.  

## 2017-02-14 LAB — CULTURE, BLOOD (ROUTINE X 2)
Culture: NO GROWTH
Culture: NO GROWTH
Special Requests: ADEQUATE
Special Requests: ADEQUATE

## 2019-03-31 IMAGING — CT CT ABD-PELV W/ CM
1 of 6 series · 4 of 46 positions shown, 5 images · IV contrast (agent unspecified)
Comparison: 09/03/2014 CT abdomen and pelvis

CLINICAL DATA: 77 y/o M; 1 day of upper abdominal pain with nausea
and vomiting.

EXAM:
CT ABDOMEN AND PELVIS WITH CONTRAST
TECHNIQUE: Multidetector CT imaging of the abdomen and pelvis was performed
using the standard protocol following bolus administration of
intravenous contrast.
CONTRAST:  100 cc Qsovue-OYY

[Series 1012: virtual unenhanced vnc mpr tra 2mm range (auto) · axial · 0.40mm/px · z∈[-377,-3]mm · 4 of 255 slices shown, 5 images]
[im 34/255  soft-tissue]
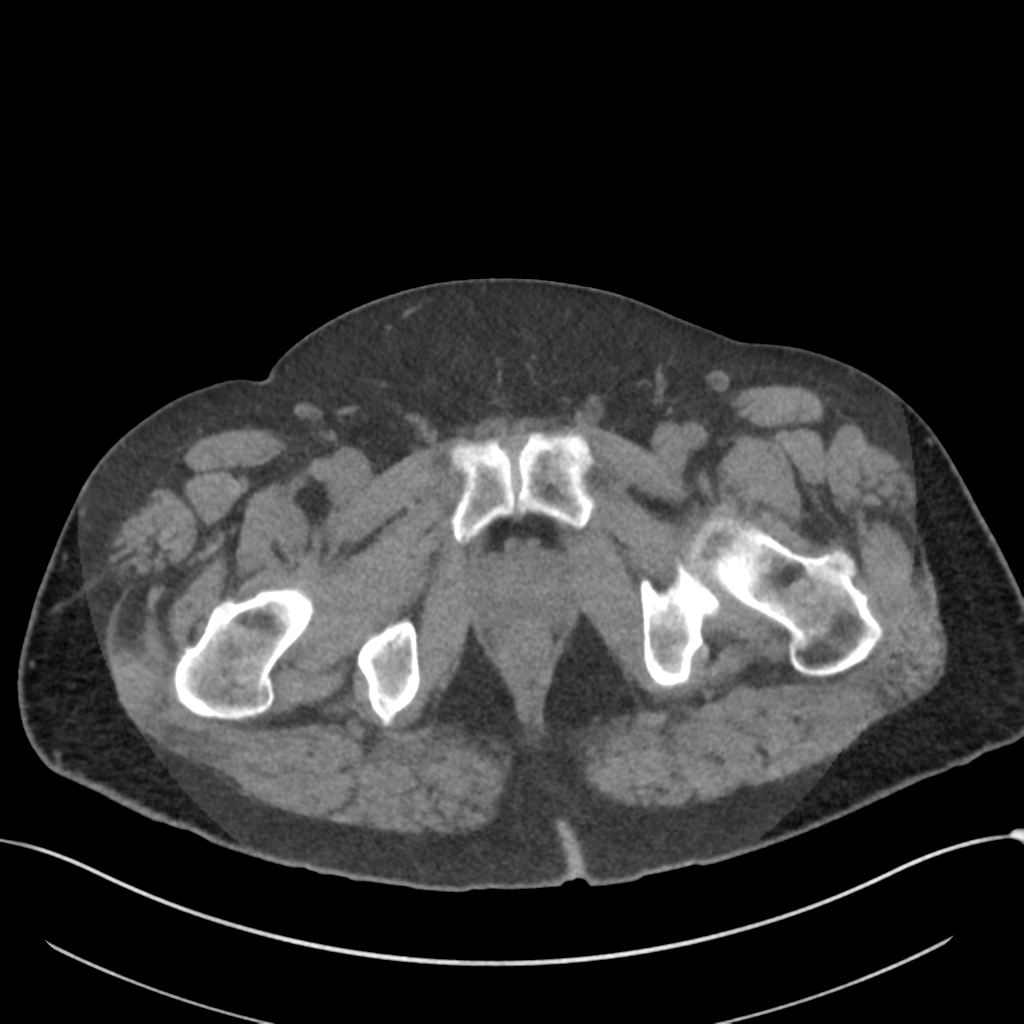
[im 34/255  bone]
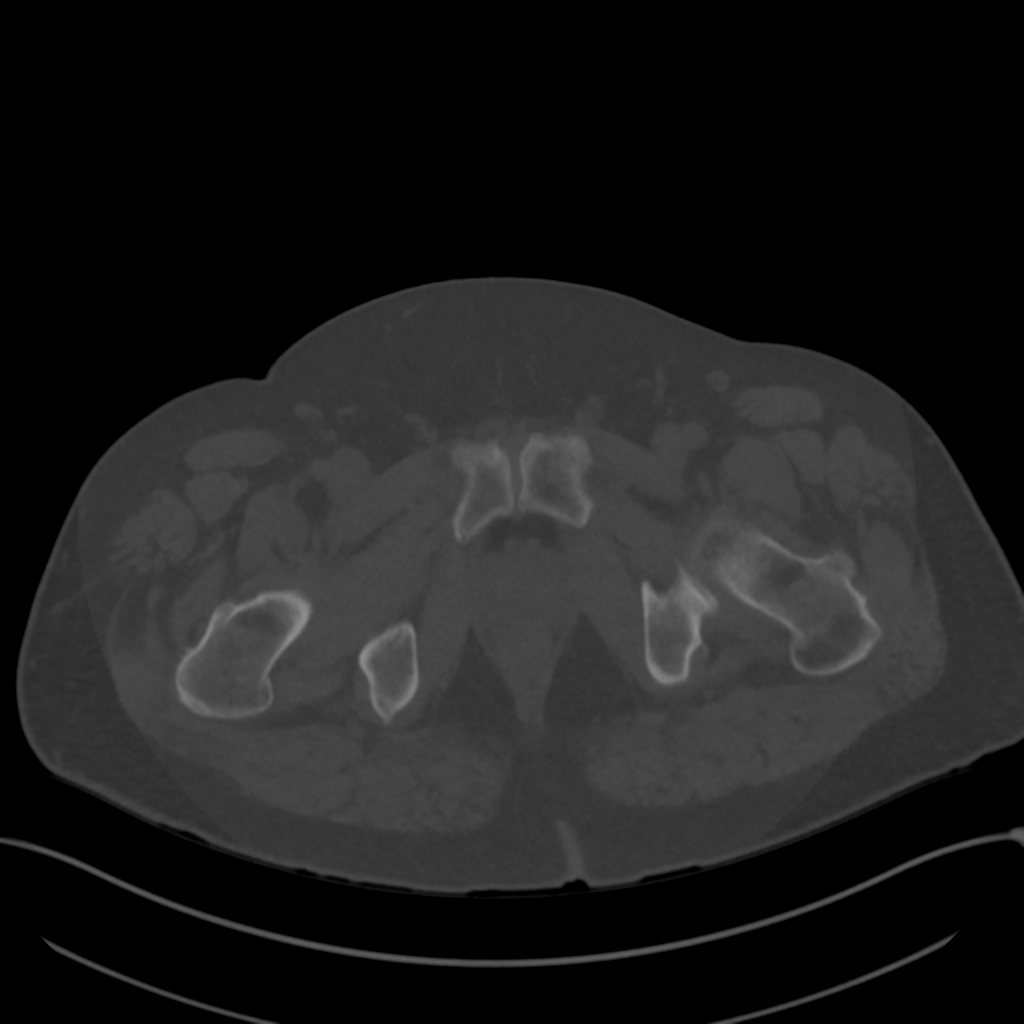
[im 102/255  soft-tissue]
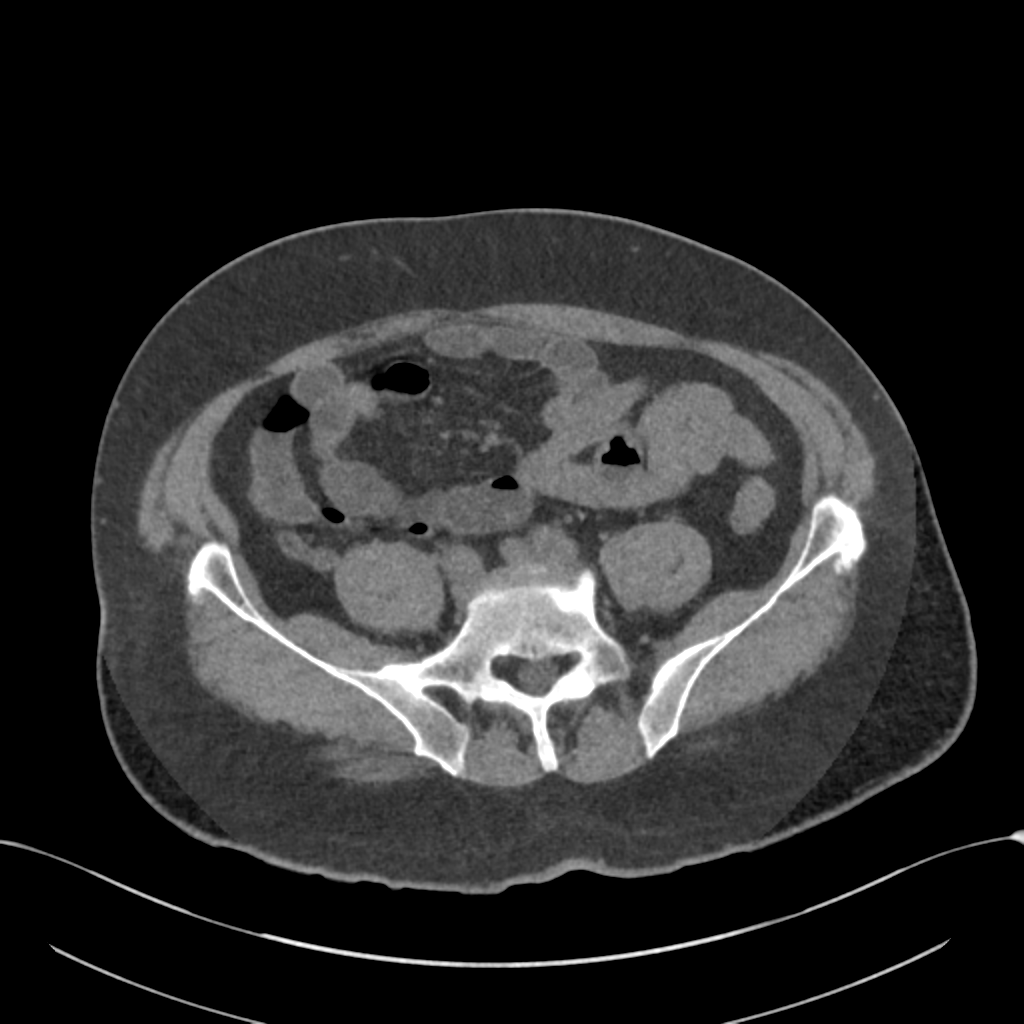
[im 153/255  soft-tissue]
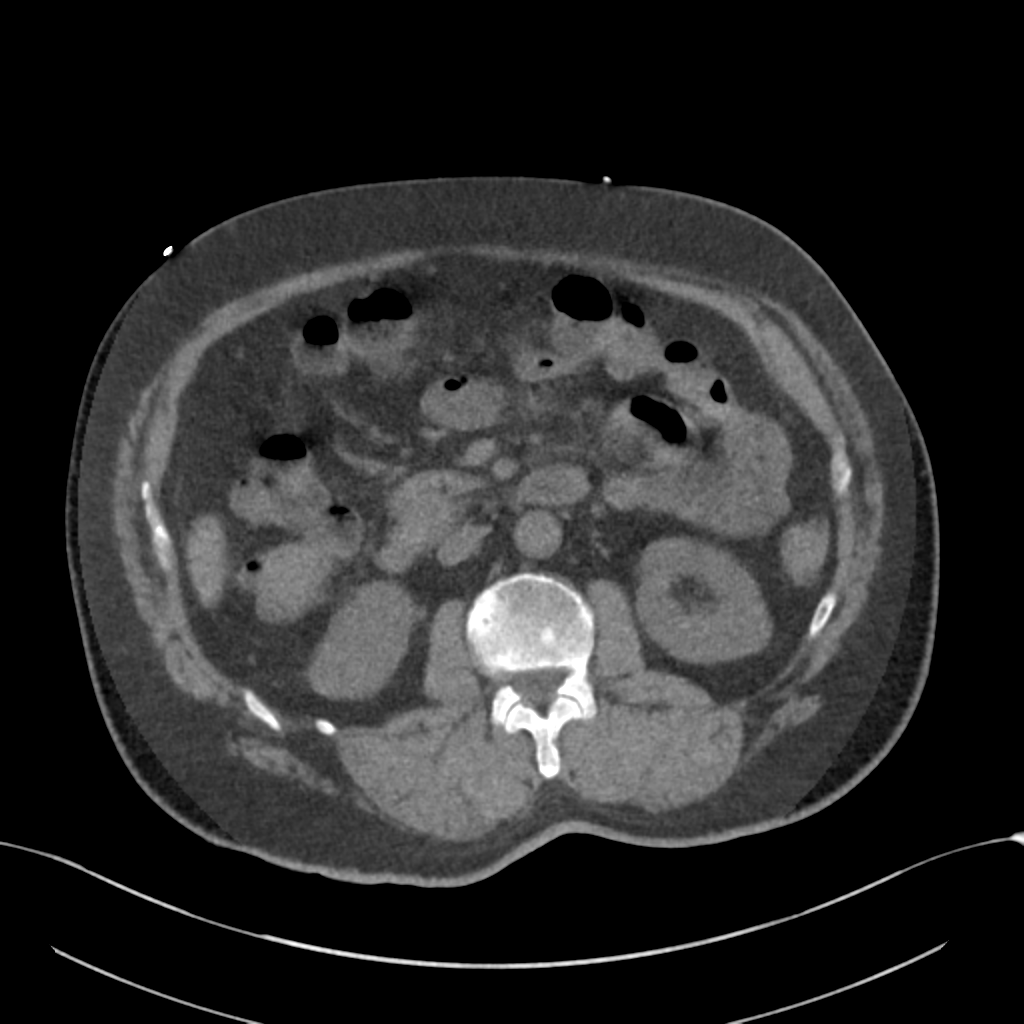
[im 221/255  soft-tissue]
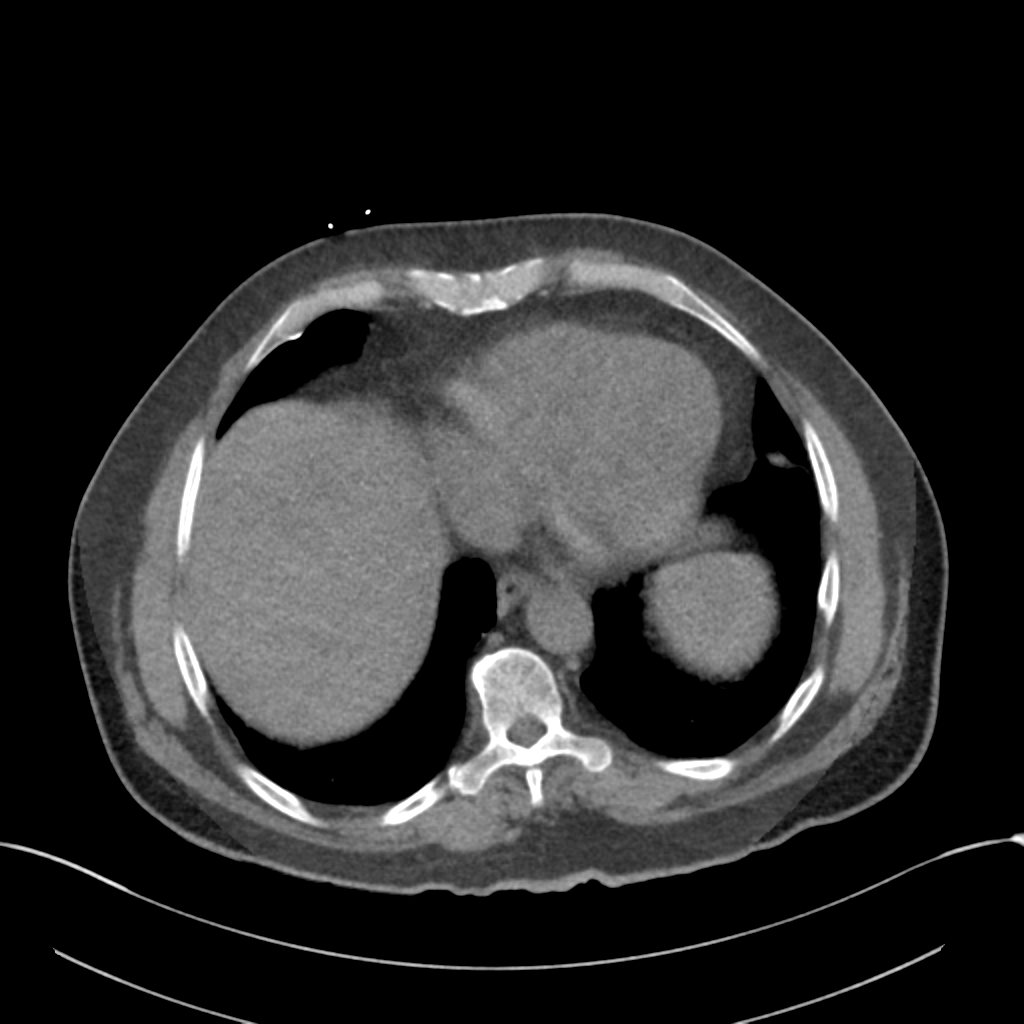

[4 of 46 positions shown; findings below may reference images not displayed]

FINDINGS: Lower chest: Stable 4 mm left lower lobe pulmonary nodule compatible
with benign etiology (series 5, image 18).

Hepatobiliary: Hepatic steatosis. No focal liver abnormality is
seen. No gallstones, gallbladder wall thickening, or biliary
dilatation.

Pancreas: Unremarkable. No pancreatic ductal dilatation or
surrounding inflammatory changes.

Spleen: Normal in size without focal abnormality.

Adrenals/Urinary Tract: Adrenal glands are unremarkable.
Subcentimeter bilateral kidney cysts. Kidneys are otherwise normal,
without renal calculi, focal lesion, or hydronephrosis. Bladder is
unremarkable.

Stomach/Bowel: Stomach is within normal limits. Appendix appears
normal. No evidence of bowel wall thickening, distention, or
inflammatory changes. Sigmoid diverticulosis.

Vascular/Lymphatic: No significant vascular findings are present. No
enlarged abdominal or pelvic lymph nodes.

Reproductive: Prostate is unremarkable.

Other: No abdominal wall hernia or abnormality. No abdominopelvic
ascites.

Musculoskeletal: No acute or significant osseous findings. Mild
lumbar spondylosis prominent lower lumbar facet arthrosis.
IMPRESSION: 1. No acute process identified.
2. Hepatic steatosis.
3. Sigmoid diverticulosis without findings of diverticulitis.

By: Zule Yee M.D.

## 2019-03-31 IMAGING — DX DG CHEST 2V
2 series · 2 of 2 positions shown · non-contrast
Comparison: 02/08/2012

CLINICAL DATA: Fever, shortness of breath, and cough. Nausea,
vomiting, and diarrhea. One day history.

EXAM:
CHEST  2 VIEW

[chest pa]
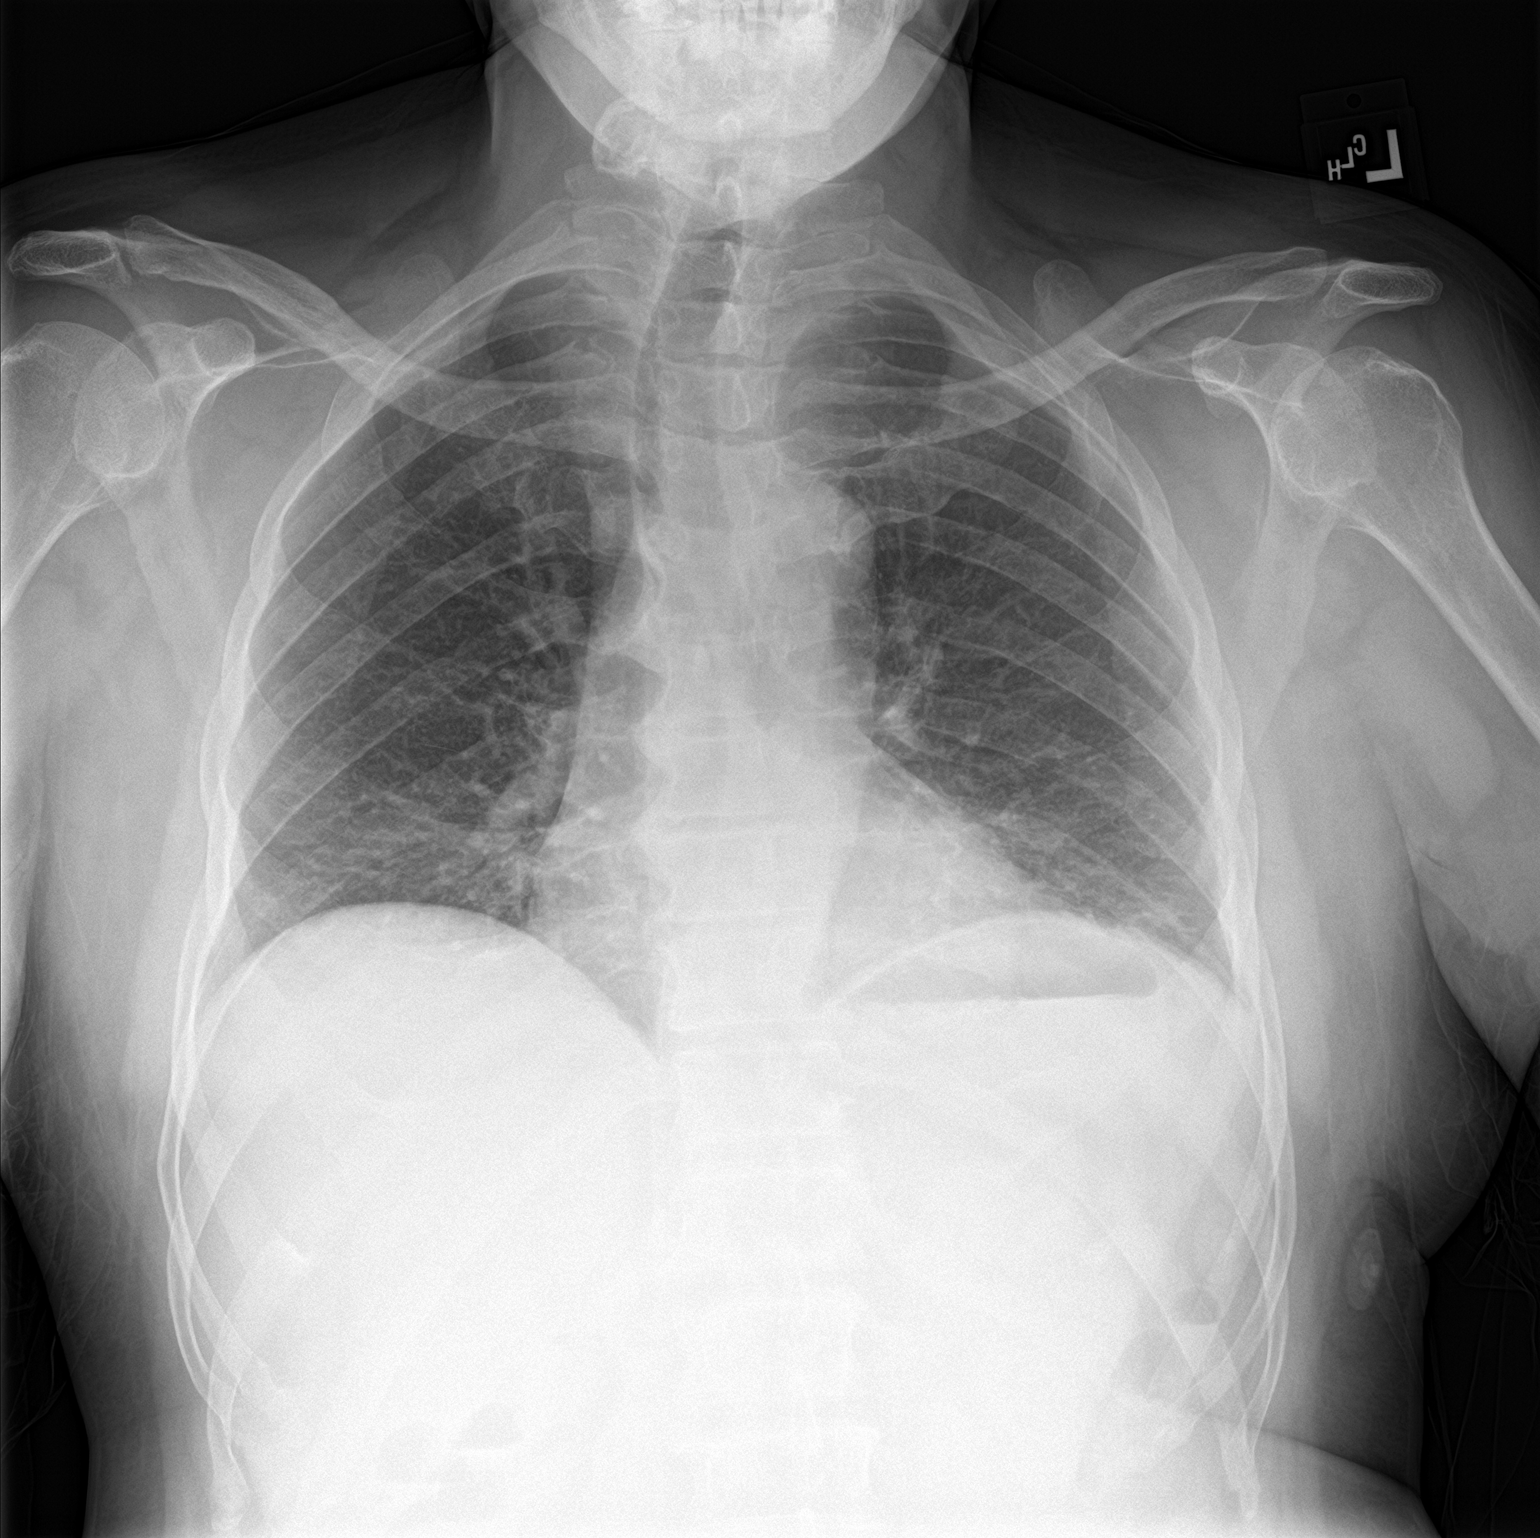

[chest lat]
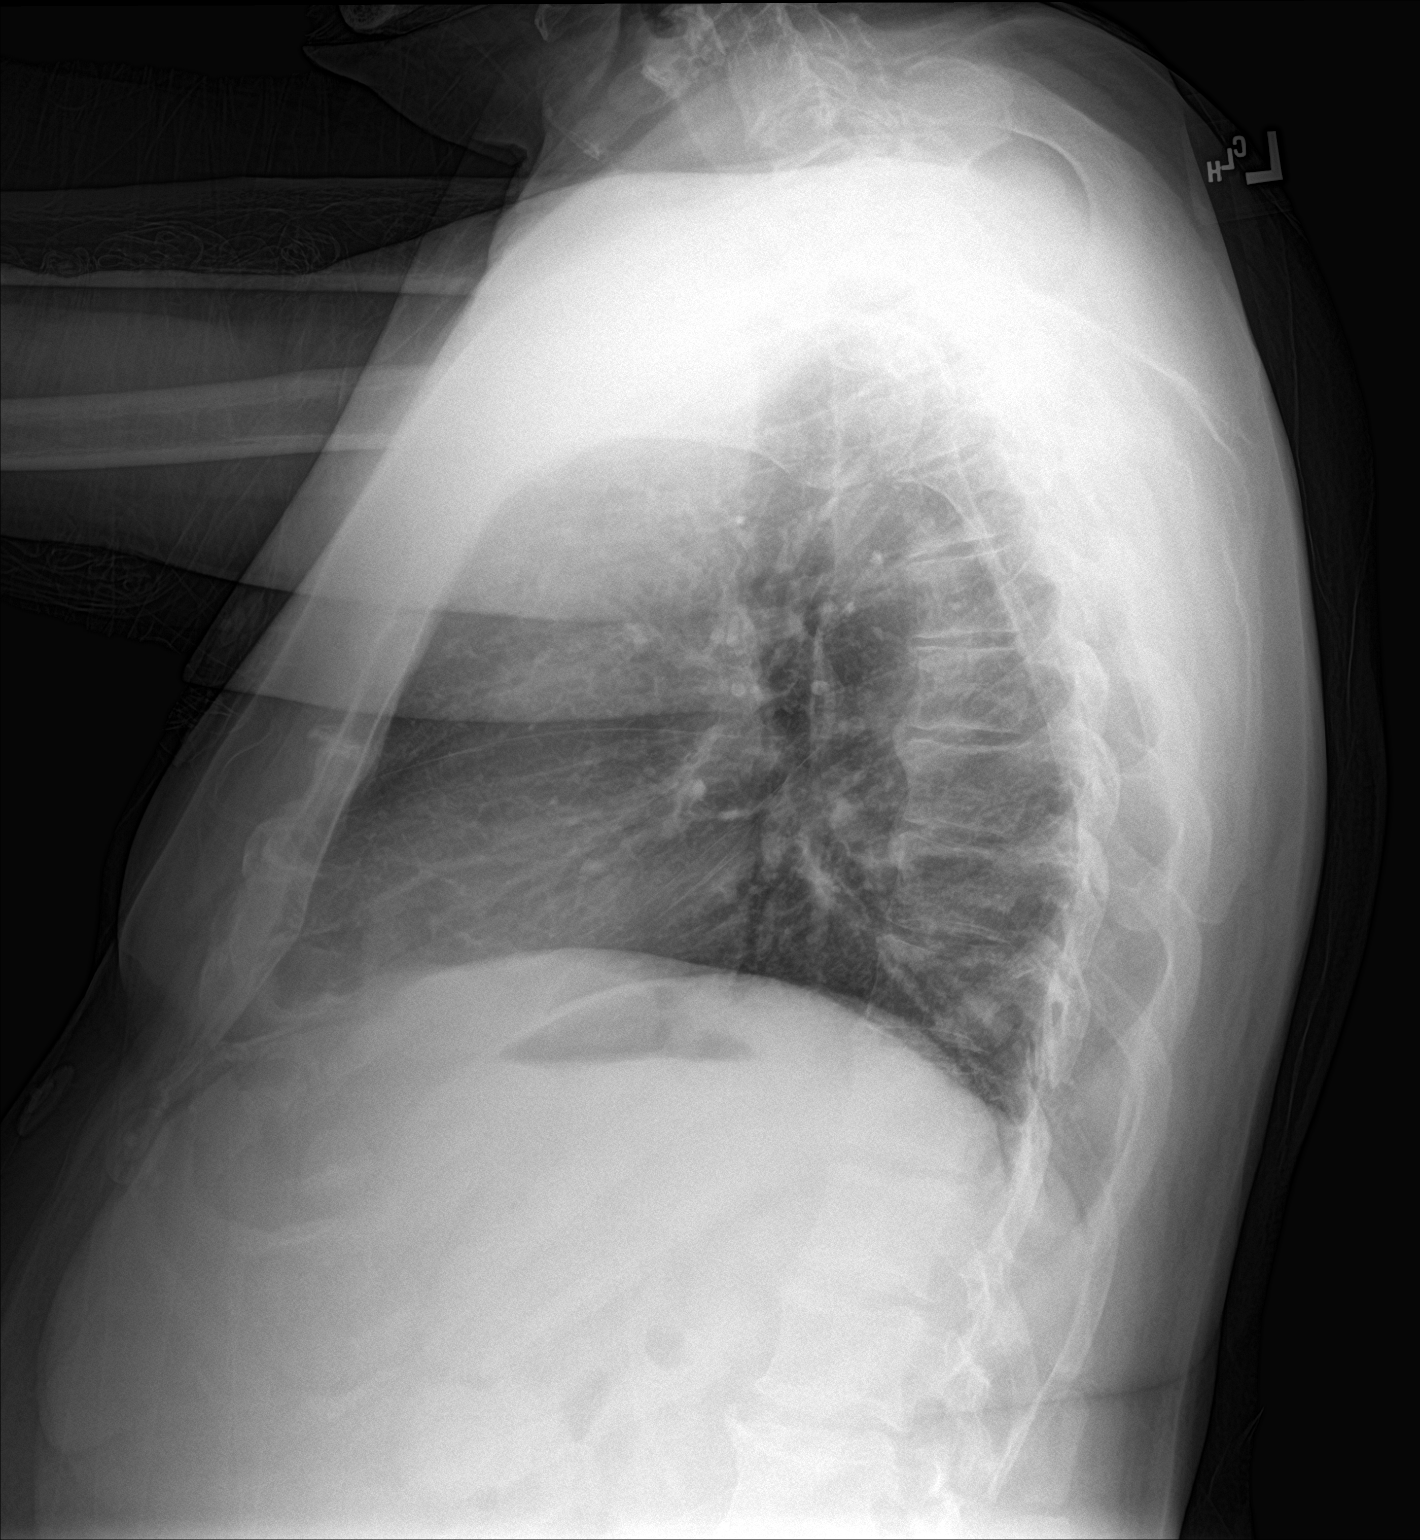

[2 of 2 positions shown; findings below may reference images not displayed]

FINDINGS: Shallow inspiration with linear atelectasis or fibrosis in the lung
bases. Heart size and pulmonary vascularity are normal. No airspace
disease or consolidation in the lungs. No blunting of costophrenic
angles. No pneumothorax. Mediastinal contours appear intact.
Degenerative changes in the spine.
IMPRESSION: Shallow inspiration with linear atelectasis or fibrosis in the lung
bases. No focal consolidation.
